# Patient Record
Sex: Male | Born: 1940
Health system: Southern US, Community
[De-identification: ages and names within clinical notes are randomized; demographics above are authoritative.]

## PROBLEM LIST (undated history)

## (undated) DIAGNOSIS — E119 Type 2 diabetes mellitus without complications: Secondary | ICD-10-CM

## (undated) DIAGNOSIS — K222 Esophageal obstruction: Secondary | ICD-10-CM

## (undated) DIAGNOSIS — K449 Diaphragmatic hernia without obstruction or gangrene: Secondary | ICD-10-CM

## (undated) DIAGNOSIS — E785 Hyperlipidemia, unspecified: Secondary | ICD-10-CM

## (undated) DIAGNOSIS — I10 Essential (primary) hypertension: Secondary | ICD-10-CM

## (undated) DIAGNOSIS — R079 Chest pain, unspecified: Secondary | ICD-10-CM

## (undated) DIAGNOSIS — I2510 Atherosclerotic heart disease of native coronary artery without angina pectoris: Secondary | ICD-10-CM

## (undated) DIAGNOSIS — K2990 Gastroduodenitis, unspecified, without bleeding: Secondary | ICD-10-CM

## (undated) DIAGNOSIS — Z720 Tobacco use: Secondary | ICD-10-CM

## (undated) HISTORY — PX: COLONOSCOPY: SHX174

## (undated) HISTORY — PX: SHOULDER SURGERY: SHX246

## (undated) HISTORY — DX: Essential (primary) hypertension: I10

## (undated) HISTORY — PX: CHOLECYSTECTOMY: SHX55

## (undated) HISTORY — PX: JOINT REPLACEMENT: SHX530

## (undated) HISTORY — DX: Hyperlipidemia, unspecified: E78.5

## (undated) HISTORY — PX: INGUINAL HERNIA REPAIR: SUR1180

## (undated) HISTORY — DX: Type 2 diabetes mellitus without complications: E11.9

## (undated) HISTORY — PX: KNEE SURGERY: SHX244

## (undated) HISTORY — PX: CARDIAC CATHETERIZATION: SHX172

## (undated) HISTORY — DX: Atherosclerotic heart disease of native coronary artery without angina pectoris: I25.10

---

## 2002-05-12 ENCOUNTER — Encounter: Payer: Self-pay | Admitting: Orthopedic Surgery

## 2002-05-12 ENCOUNTER — Encounter: Admission: RE | Admit: 2002-05-12 | Discharge: 2002-05-12 | Payer: Self-pay | Admitting: Orthopedic Surgery

## 2007-02-10 ENCOUNTER — Ambulatory Visit: Payer: Self-pay | Admitting: Cardiology

## 2008-02-08 ENCOUNTER — Ambulatory Visit: Payer: Self-pay | Admitting: Cardiology

## 2011-06-28 ENCOUNTER — Other Ambulatory Visit: Payer: Self-pay | Admitting: Internal Medicine

## 2011-06-28 DIAGNOSIS — M549 Dorsalgia, unspecified: Secondary | ICD-10-CM

## 2011-07-09 ENCOUNTER — Other Ambulatory Visit: Payer: Self-pay

## 2011-07-09 ENCOUNTER — Inpatient Hospital Stay
Admission: RE | Admit: 2011-07-09 | Discharge: 2011-07-09 | Payer: Self-pay | Source: Ambulatory Visit | Attending: Internal Medicine | Admitting: Internal Medicine

## 2011-07-09 DIAGNOSIS — M5126 Other intervertebral disc displacement, lumbar region: Secondary | ICD-10-CM

## 2011-11-21 DIAGNOSIS — R072 Precordial pain: Secondary | ICD-10-CM

## 2012-03-11 ENCOUNTER — Encounter: Payer: Self-pay | Admitting: Cardiology

## 2012-03-16 ENCOUNTER — Ambulatory Visit (INDEPENDENT_AMBULATORY_CARE_PROVIDER_SITE_OTHER): Payer: PRIVATE HEALTH INSURANCE | Admitting: Cardiology

## 2012-03-16 ENCOUNTER — Encounter: Payer: Self-pay | Admitting: *Deleted

## 2012-03-16 ENCOUNTER — Encounter: Payer: Self-pay | Admitting: Cardiology

## 2012-03-16 VITALS — BP 142/88 | HR 63 | Ht 67.0 in | Wt 178.8 lb

## 2012-03-16 DIAGNOSIS — R079 Chest pain, unspecified: Secondary | ICD-10-CM | POA: Insufficient documentation

## 2012-03-16 DIAGNOSIS — E785 Hyperlipidemia, unspecified: Secondary | ICD-10-CM | POA: Insufficient documentation

## 2012-03-16 DIAGNOSIS — I10 Essential (primary) hypertension: Secondary | ICD-10-CM | POA: Insufficient documentation

## 2012-03-16 NOTE — Assessment & Plan Note (Signed)
I will defer to [facility], MD with a goal LDL less than 100 and HDL greater than 40.

## 2012-03-16 NOTE — Assessment & Plan Note (Signed)
I have reviewed the previous reported a stress test. After careful questioning I think the pretest probability of obstructive coronary disease is still low. However, he could not be excluded with stress perfusion imaging. I don't think catheterization is indicated but rather would have him have a dobutamine echocardiogram given his multiple risk factors. If this is normal no further testing would be indicated and he will be referred back to [facility], MD for further workup and consideration of possibly a GI etiology.

## 2012-03-16 NOTE — Patient Instructions (Addendum)
Your physician recommends that you schedule a follow-up appointment in: as needed.  Your physician recommends that you continue on your current medications as directed. Please refer to the Current Medication list given to you today.   Your physician has requested that you have a dobutamine myoview. For furth information please visit [email or web address] Please follow instruction sheet, as given.

## 2012-03-16 NOTE — Progress Notes (Signed)
 HPI The patient presents for evaluation of chest discomfort. He has no past cardiac history. He has a history of previous stress testing at the [facility] some years ago for evaluation of chest pain. He thinks this was normal. He says that for several months he's been getting chest discomfort at night. He points to his left axilla and radiating around to his back. His hands will start to tingle. He doesn't describe substernal discomfort, jaw or arm discomfort. He cannot bring this on with activities. He does some heavy activity such as hand cranking the landing gear on his truck. He does not describe associated symptoms such as nausea vomiting or diaphoresis. He doesn't have any palpitations, presyncope or syncope. He denies any acute shortness of breath, PND or orthopnea. He does have to sit up at times because his hands are tingling in the middle of the night. It'll go away after several minutes. He did have a stress perfusion study in April that was somewhat inconclusive with inferior and mid anterior defect consistent with attenuation. The EF was 57%. Obstructive coronary disease could not be definitively excluded.  Not on File  Current Outpatient Prescriptions  Medication Sig Dispense Refill  . calcium carbonate (TUMS - DOSED IN MG ELEMENTAL CALCIUM) 500 MG chewable tablet Chew 1 tablet by mouth as needed.       . enalapril (VASOTEC) 10 MG tablet Take 10 mg by mouth daily.      . meclizine (ANTIVERT) 25 MG tablet Take 25 mg by mouth 3 (three) times daily as needed.      . metFORMIN (GLUCOPHAGE) 850 MG tablet Take 850 mg by mouth daily.      . omeprazole (PRILOSEC) 20 MG capsule Take 20 mg by mouth as needed.       . simvastatin (ZOCOR) 40 MG tablet Take 40 mg by mouth every evening.        Past Medical History  Diagnosis Date  . Diabetes mellitus type II   . Essential hypertension   . Dyslipidemia     Past Surgical History  Procedure Date  . Inguinal hernia repair    bilateral  . Shoulder surgery     right  . Knee surgery     left arthroscopy  . Cholecystectomy     ROS:  As stated in the HPI and negative for all other systems.  PHYSICAL EXAM BP 142/88  Pulse 63  Ht 5' 7" (1.702 m)  Wt 178 lb 12.8 oz (81.103 kg)  BMI 28.00 kg/m2  SpO2 97% GENERAL:  Well appearing, he looks much younger than his stated age  HEENT:  Pupils equal round and reactive, fundi not visualized, oral mucosa unremarkable, dentures and poor dentition  NECK:  No jugular venous distention, waveform within normal limits, carotid upstroke brisk and symmetric, no bruits, no thyromegaly LYMPHATICS:  No cervical, inguinal adenopathy LUNGS:  Clear to auscultation bilaterally BACK:  No CVA tenderness CHEST:  Unremarkable HEART:  PMI not displaced or sustained,S1 and S2 within normal limits, no S3, no S4, no clicks, no rubs, no murmurs ABD:  Flat, positive bowel sounds normal in frequency in pitch, no bruits, no rebound, no guarding, no midline pulsatile mass, no hepatomegaly, no splenomegaly EXT:  2 plus pulses throughout, no edema, no cyanosis no clubbing SKIN:  No rashes no nodules NEURO:  Cranial nerves II through XII grossly intact, motor grossly intact throughout PSYCH:  Cognitively intact, oriented to person place and time   EKG:  Sinus   rhythm, rate 57, premature atrial contractions, axis within normal limits, intervals within normal limits, no acute ST-T wave changes.  ASSESSMENT AND PLAN  

## 2012-03-16 NOTE — Assessment & Plan Note (Signed)
His blood pressure is mildly elevated year. He will continue the meds as listed. He should keep a blood pressure diary. The goal will be 130/85 or lower given his diabetes.

## 2012-03-17 ENCOUNTER — Telehealth: Payer: Self-pay

## 2012-03-17 ENCOUNTER — Other Ambulatory Visit: Payer: Self-pay | Admitting: Cardiology

## 2012-03-17 DIAGNOSIS — R079 Chest pain, unspecified: Secondary | ICD-10-CM

## 2012-03-17 NOTE — Telephone Encounter (Signed)
No precert required 

## 2012-03-17 NOTE — Telephone Encounter (Signed)
Echocardiogram Dobutamine stress  Scheduled for 7-15 mmh Checking percert

## 2012-03-22 DIAGNOSIS — R079 Chest pain, unspecified: Secondary | ICD-10-CM

## 2012-03-23 ENCOUNTER — Encounter (HOSPITAL_COMMUNITY)
Admission: AD | Disposition: A | Discharge status: Home / Self Care | Payer: Self-pay | Source: Other Acute Inpatient Hospital | Attending: Cardiovascular Disease

## 2012-03-23 ENCOUNTER — Encounter (HOSPITAL_COMMUNITY): Payer: Self-pay | Admitting: *Deleted

## 2012-03-23 ENCOUNTER — Observation Stay (HOSPITAL_COMMUNITY)
Admission: AD | Admit: 2012-03-23 | Discharge: 2012-03-25 | DRG: 313 | Disposition: A | Discharge status: Home / Self Care | Payer: Medicare Other | Source: Other Acute Inpatient Hospital | Attending: Cardiovascular Disease | Admitting: Cardiovascular Disease

## 2012-03-23 ENCOUNTER — Other Ambulatory Visit: Payer: Self-pay | Admitting: Physician Assistant

## 2012-03-23 ENCOUNTER — Other Ambulatory Visit: Payer: Self-pay

## 2012-03-23 DIAGNOSIS — E785 Hyperlipidemia, unspecified: Secondary | ICD-10-CM | POA: Insufficient documentation

## 2012-03-23 DIAGNOSIS — I2510 Atherosclerotic heart disease of native coronary artery without angina pectoris: Secondary | ICD-10-CM

## 2012-03-23 DIAGNOSIS — Q391 Atresia of esophagus with tracheo-esophageal fistula: Secondary | ICD-10-CM | POA: Insufficient documentation

## 2012-03-23 DIAGNOSIS — F17200 Nicotine dependence, unspecified, uncomplicated: Secondary | ICD-10-CM | POA: Insufficient documentation

## 2012-03-23 DIAGNOSIS — R079 Chest pain, unspecified: Secondary | ICD-10-CM

## 2012-03-23 DIAGNOSIS — Q393 Congenital stenosis and stricture of esophagus: Secondary | ICD-10-CM | POA: Insufficient documentation

## 2012-03-23 DIAGNOSIS — R0789 Other chest pain: Principal | ICD-10-CM | POA: Insufficient documentation

## 2012-03-23 DIAGNOSIS — K449 Diaphragmatic hernia without obstruction or gangrene: Secondary | ICD-10-CM | POA: Insufficient documentation

## 2012-03-23 DIAGNOSIS — I200 Unstable angina: Secondary | ICD-10-CM

## 2012-03-23 DIAGNOSIS — E119 Type 2 diabetes mellitus without complications: Secondary | ICD-10-CM | POA: Insufficient documentation

## 2012-03-23 DIAGNOSIS — K2990 Gastroduodenitis, unspecified, without bleeding: Secondary | ICD-10-CM

## 2012-03-23 DIAGNOSIS — K2940 Chronic atrophic gastritis without bleeding: Secondary | ICD-10-CM | POA: Insufficient documentation

## 2012-03-23 DIAGNOSIS — K222 Esophageal obstruction: Secondary | ICD-10-CM

## 2012-03-23 DIAGNOSIS — K2980 Duodenitis without bleeding: Secondary | ICD-10-CM | POA: Insufficient documentation

## 2012-03-23 DIAGNOSIS — I10 Essential (primary) hypertension: Secondary | ICD-10-CM | POA: Insufficient documentation

## 2012-03-23 HISTORY — DX: Gastroduodenitis, unspecified, without bleeding: K29.90

## 2012-03-23 HISTORY — DX: Esophageal obstruction: K22.2

## 2012-03-23 HISTORY — DX: Diaphragmatic hernia without obstruction or gangrene: K44.9

## 2012-03-23 HISTORY — DX: Chest pain, unspecified: R07.9

## 2012-03-23 HISTORY — PX: LEFT HEART CATHETERIZATION WITH CORONARY ANGIOGRAM: SHX5451

## 2012-03-23 HISTORY — DX: Tobacco use: Z72.0

## 2012-03-23 LAB — GLUCOSE, CAPILLARY: Glucose-Capillary: 98 mg/dL (ref 70–99)

## 2012-03-23 LAB — LIPASE, BLOOD: Lipase: 17 U/L (ref 11–59)

## 2012-03-23 LAB — HEPATIC FUNCTION PANEL
Albumin: 3.5 g/dL (ref 3.5–5.2)
Indirect Bilirubin: 0.3 mg/dL (ref 0.3–0.9)
Total Bilirubin: 0.4 mg/dL (ref 0.3–1.2)
Total Protein: 6.5 g/dL (ref 6.0–8.3)

## 2012-03-23 SURGERY — LEFT HEART CATHETERIZATION WITH CORONARY ANGIOGRAM
Anesthesia: LOCAL

## 2012-03-23 MED ORDER — DIAZEPAM 2 MG PO TABS: 2.0000 mg | ORAL_TABLET | Freq: Two times a day (BID) | ORAL | Status: DC | PRN

## 2012-03-23 MED ORDER — PANTOPRAZOLE SODIUM 40 MG PO TBEC
40.0000 mg | DELAYED_RELEASE_TABLET | Freq: Every day | ORAL | Status: DC
  Administered 2012-03-23: 20:00:00 40 mg via ORAL
  Filled 2012-03-23: qty 1

## 2012-03-23 MED ORDER — ONDANSETRON HCL 4 MG/2ML IJ SOLN: 4.0000 mg | Freq: Four times a day (QID) | INTRAMUSCULAR | Status: DC | PRN

## 2012-03-23 MED ORDER — ASPIRIN EC 81 MG PO TBEC
81.0000 mg | DELAYED_RELEASE_TABLET | Freq: Every day | ORAL | Status: DC
  Administered 2012-03-24: 81 mg via ORAL
  Filled 2012-03-23 (×2): qty 1

## 2012-03-23 MED ORDER — ACETAMINOPHEN 325 MG PO TABS: 650.0000 mg | ORAL_TABLET | ORAL | Status: DC | PRN

## 2012-03-23 MED ORDER — ASPIRIN 81 MG PO CHEW: 324.0000 mg | CHEWABLE_TABLET | ORAL | Status: DC

## 2012-03-23 MED ORDER — MIDAZOLAM HCL 2 MG/2ML IJ SOLN
INTRAMUSCULAR | Status: AC
  Filled 2012-03-23: qty 2

## 2012-03-23 MED ORDER — SIMVASTATIN 40 MG PO TABS: 40.0000 mg | ORAL_TABLET | Freq: Every day | ORAL | Status: DC

## 2012-03-23 MED ORDER — SODIUM CHLORIDE 0.9 % IJ SOLN
3.0000 mL | Freq: Two times a day (BID) | INTRAMUSCULAR | Status: DC
  Administered 2012-03-24 – 2012-03-25 (×3): 3 mL via INTRAVENOUS

## 2012-03-23 MED ORDER — NITROGLYCERIN 0.2 MG/ML ON CALL CATH LAB
INTRAVENOUS | Status: AC
  Filled 2012-03-23: qty 1

## 2012-03-23 MED ORDER — PANTOPRAZOLE SODIUM 40 MG PO TBEC: 40.0000 mg | DELAYED_RELEASE_TABLET | Freq: Every day | ORAL | Status: DC

## 2012-03-23 MED ORDER — OXYCODONE-ACETAMINOPHEN 5-325 MG PO TABS: 1.0000 | ORAL_TABLET | ORAL | Status: DC | PRN

## 2012-03-23 MED ORDER — NITROGLYCERIN 0.4 MG SL SUBL: 0.4000 mg | SUBLINGUAL_TABLET | SUBLINGUAL | Status: DC | PRN

## 2012-03-23 MED ORDER — SODIUM CHLORIDE 0.9 % IV SOLN: 250.0000 mL | INTRAVENOUS | Status: DC

## 2012-03-23 MED ORDER — FENTANYL CITRATE 0.05 MG/ML IJ SOLN
INTRAMUSCULAR | Status: AC
  Filled 2012-03-23: qty 2

## 2012-03-23 MED ORDER — ENALAPRIL MALEATE 10 MG PO TABS: 10.0000 mg | ORAL_TABLET | Freq: Every day | ORAL | Status: DC

## 2012-03-23 MED ORDER — ENALAPRIL MALEATE 10 MG PO TABS
10.0000 mg | ORAL_TABLET | Freq: Every day | ORAL | Status: DC
  Administered 2012-03-23 – 2012-03-25 (×3): 10 mg via ORAL
  Filled 2012-03-23 (×3): qty 1

## 2012-03-23 MED ORDER — ASPIRIN 300 MG RE SUPP
300.0000 mg | RECTAL | Status: DC
  Filled 2012-03-23: qty 1

## 2012-03-23 MED ORDER — SODIUM CHLORIDE 0.45 % IV SOLN
INTRAVENOUS | Status: AC
  Administered 2012-03-23: 17:00:00 via INTRAVENOUS

## 2012-03-23 MED ORDER — SIMVASTATIN 40 MG PO TABS
40.0000 mg | ORAL_TABLET | Freq: Every evening | ORAL | Status: DC
  Administered 2012-03-23 – 2012-03-24 (×2): 40 mg via ORAL
  Filled 2012-03-23 (×3): qty 1

## 2012-03-23 MED ORDER — SODIUM CHLORIDE 0.9 % IJ SOLN: 3.0000 mL | INTRAMUSCULAR | Status: DC | PRN

## 2012-03-23 MED ORDER — HEPARIN (PORCINE) IN NACL 2-0.9 UNIT/ML-% IJ SOLN
INTRAMUSCULAR | Status: AC
  Filled 2012-03-23: qty 2000

## 2012-03-23 MED ORDER — LIDOCAINE HCL (PF) 1 % IJ SOLN
INTRAMUSCULAR | Status: AC
  Filled 2012-03-23: qty 30

## 2012-03-23 NOTE — H&P (View-Only) (Signed)
HPI The patient presents for evaluation of chest discomfort. He has no past cardiac history. He has a history of previous stress testing at the [facility] some years ago for evaluation of chest pain. He thinks this was normal. He says that for several months he's been getting chest discomfort at night. He points to his left axilla and radiating around to his back. His hands will start to tingle. He doesn't describe substernal discomfort, jaw or arm discomfort. He cannot bring this on with activities. He does some heavy activity such as hand cranking the landing gear on his truck. He does not describe associated symptoms such as nausea vomiting or diaphoresis. He doesn't have any palpitations, presyncope or syncope. He denies any acute shortness of breath, PND or orthopnea. He does have to sit up at times because his hands are tingling in the middle of the night. It'll go away after several minutes. He did have a stress perfusion study in April that was somewhat inconclusive with inferior and mid anterior defect consistent with attenuation. The EF was 57%. Obstructive coronary disease could not be definitively excluded.  Not on File  Current Outpatient Prescriptions  Medication Sig Dispense Refill  . calcium carbonate (TUMS - DOSED IN MG ELEMENTAL CALCIUM) 500 MG chewable tablet Chew 1 tablet by mouth as needed.       . enalapril (VASOTEC) 10 MG tablet Take 10 mg by mouth daily.      . meclizine (ANTIVERT) 25 MG tablet Take 25 mg by mouth 3 (three) times daily as needed.      . metFORMIN (GLUCOPHAGE) 850 MG tablet Take 850 mg by mouth daily.      Marland Kitchen omeprazole (PRILOSEC) 20 MG capsule Take 20 mg by mouth as needed.       . simvastatin (ZOCOR) 40 MG tablet Take 40 mg by mouth every evening.        Past Medical History  Diagnosis Date  . Diabetes mellitus type II   . Essential hypertension   . Dyslipidemia     Past Surgical History  Procedure Date  . Inguinal hernia repair    bilateral  . Shoulder surgery     right  . Knee surgery     left arthroscopy  . Cholecystectomy     ROS:  As stated in the HPI and negative for all other systems.  PHYSICAL EXAM BP 142/88  Pulse 63  Ht 5\' 7"  (1.702 m)  Wt 178 lb 12.8 oz (81.103 kg)  BMI 28.00 kg/m2  SpO2 97% GENERAL:  Well appearing, he looks much younger than his stated age  HEENT:  Pupils equal round and reactive, fundi not visualized, oral mucosa unremarkable, dentures and poor dentition  NECK:  No jugular venous distention, waveform within normal limits, carotid upstroke brisk and symmetric, no bruits, no thyromegaly LYMPHATICS:  No cervical, inguinal adenopathy LUNGS:  Clear to auscultation bilaterally BACK:  No CVA tenderness CHEST:  Unremarkable HEART:  PMI not displaced or sustained,S1 and S2 within normal limits, no S3, no S4, no clicks, no rubs, no murmurs ABD:  Flat, positive bowel sounds normal in frequency in pitch, no bruits, no rebound, no guarding, no midline pulsatile mass, no hepatomegaly, no splenomegaly EXT:  2 plus pulses throughout, no edema, no cyanosis no clubbing SKIN:  No rashes no nodules NEURO:  Cranial nerves II through XII grossly intact, motor grossly intact throughout PSYCH:  Cognitively intact, oriented to person place and time   EKG:  Sinus  rhythm, rate 57, premature atrial contractions, axis within normal limits, intervals within normal limits, no acute ST-T wave changes.  ASSESSMENT AND PLAN

## 2012-03-23 NOTE — Interval H&P Note (Signed)
History and Physical Interval Note:  03/23/2012 4:03 PM  Richard Rocha  has presented today for surgery, with the diagnosis of Chest pain  The various methods of treatment have been discussed with the patient and family. After consideration of risks, benefits and other options for treatment, the patient has consented to  Procedure(s) (LRB): LEFT HEART CATHETERIZATION WITH CORONARY ANGIOGRAM (N/A) as a surgical intervention .  The patient's history has been reviewed, patient examined, no change in status, stable for surgery.  I have reviewed the patients' chart and labs.  Questions were answered to the patient's satisfaction.     Charlton Haws

## 2012-03-23 NOTE — CV Procedure (Signed)
      Catheterization   Indication: Recurrent chest pain  Procedure: After informed consent and clinical "time out" the left groin was prepped and draped in a sterile fashion.  The patient had recent rigth hernia surgery and was still a bit tender  A 5Fr sheath was placed in the right femoral artery using seldinger technique and local lidocaine.  Standard JL4, JR4 and angled pigtail catheters were used to engage the coronary arteries.  Coronary arteries were visualized in orthogonal views using caudal and cranial angulation.  RAO ventriculography was done using 24* cc of contrast.    Medications:   Versed: 2 mg's  Fentanyl: 25 ug's  Coronary Arteries: Right dominant with no anomalies  LM: Normal  LAD: 30% ostial stenosis smooth. Normal mid and distal vessel    D1: normal and small  D2 normal and large  Circumflex: left dominant normal   OM1: normal  OM2: nomral  RCA: small nondominant and normal   PDA: left sided normal  PLA: left sided and normal  Ventriculography: EF: 65 %,  No RWMA;s  Hemodynamics:  Aortic Pressure: 157 73  mmHg  LV Pressure: 157 7  mmHg  Impression:  No signifcant CAD.  Per Dr Antoine Poche LFTls amylase and lipase drawn in lab and our GI doctors will see in am  Charlton Haws 03/23/2012 4:51 PM

## 2012-03-24 ENCOUNTER — Other Ambulatory Visit: Payer: Self-pay

## 2012-03-24 ENCOUNTER — Inpatient Hospital Stay (HOSPITAL_COMMUNITY): Payer: Medicare Other

## 2012-03-24 DIAGNOSIS — Q391 Atresia of esophagus with tracheo-esophageal fistula: Secondary | ICD-10-CM

## 2012-03-24 DIAGNOSIS — R079 Chest pain, unspecified: Secondary | ICD-10-CM

## 2012-03-24 DIAGNOSIS — Q393 Congenital stenosis and stricture of esophagus: Secondary | ICD-10-CM

## 2012-03-24 DIAGNOSIS — K2990 Gastroduodenitis, unspecified, without bleeding: Secondary | ICD-10-CM

## 2012-03-24 DIAGNOSIS — K2970 Gastritis, unspecified, without bleeding: Secondary | ICD-10-CM

## 2012-03-24 LAB — GLUCOSE, CAPILLARY
Glucose-Capillary: 138 mg/dL — ABNORMAL HIGH (ref 70–99)
Glucose-Capillary: 116 mg/dL — ABNORMAL HIGH (ref 70–99)
Glucose-Capillary: 160 mg/dL — ABNORMAL HIGH (ref 70–99)
Glucose-Capillary: 178 mg/dL — ABNORMAL HIGH (ref 70–99)

## 2012-03-24 LAB — BASIC METABOLIC PANEL
CO2: 24 mEq/L (ref 19–32)
Calcium: 9 mg/dL (ref 8.4–10.5)
Creatinine, Ser: 0.83 mg/dL (ref 0.50–1.35)
GFR calc Af Amer: >90 mL/min (ref >90)
GFR calc non Af Amer: 86 mL/min — ABNORMAL LOW (ref >90)
Sodium: 139 mEq/L (ref 135–145)

## 2012-03-24 LAB — LIPID PANEL
Cholesterol: 148 mg/dL (ref 0–200)
Triglycerides: 237 mg/dL — ABNORMAL HIGH (ref <150)

## 2012-03-24 LAB — CBC
MCH: 30.1 pg (ref 26.0–34.0)
Platelets: 167 10*3/uL (ref 150–400)
RBC: 4.82 MIL/uL (ref 4.22–5.81)
RDW: 13.7 % (ref 11.5–15.5)

## 2012-03-24 MED ORDER — PANTOPRAZOLE SODIUM 40 MG PO TBEC
40.0000 mg | DELAYED_RELEASE_TABLET | Freq: Two times a day (BID) | ORAL | Status: DC
  Administered 2012-03-24 – 2012-03-25 (×2): 40 mg via ORAL
  Filled 2012-03-24 (×2): qty 1

## 2012-03-24 NOTE — Consult Note (Signed)
Referring Provider: No ref. provider found Primary Care Physician:  Kirstie Peri, MD Primary Gastroenterologist:  none  Reason for Consultation: epigastric and chest pain  HPI: Richard Rocha is a 71 y.o. male with history of adult onset diabetes mellitus, hypertension and hyperlipidemia. He is status post cholecystectomy done several years ago and has had several orthopedic surgeries. He was admitted yesterday for further evaluation of subxiphoid and chest pain. Patient had presented to [facility] early on Sunday morning after being awakened with pain. He underwent catheter yesterday which did not show any significant coronary artery disease.  Patient says he has been having similar episodes of pain intermittently over the past several months, these episodes have become more frequent and more intense when they do occur. He says there is no pattern is worse he can tell, however he seems to be having frequent symptoms in the middle of the night. He describes the pain as being located in the epigastrium very sharp radiating straight through into his back which she describes as knifelike and then up into his mid chest. He says these usually only last for about 10 minutes and then resolve. The episode he had on Sunday did not resolve quickly and he came to the ER. These episodes are not associated with any nausea vomiting diaphoresis or shortness of breath. He says his appetite has been fine his weight has been stable he denies any dysphagia or odynophagia. He does have intermittent problems with heartburn and says that he has Prilosec which he does not take regularly and also uses TUMS as needed. He denies any regular EtOH, he had been taking one baby aspirin daily denies any regular NSAIDs He has not had any prior GI evaluation. He feels fine today and says he has not had any pain since admission to the hospital. Review of his labs show a normal CBC, normal hepatic panel, amylase, and lipase on  admission.   Past Medical History  Diagnosis Date  . Diabetes mellitus type II   . Essential hypertension   . Dyslipidemia   . Shortness of breath   . Arthritis     Past Surgical History  Procedure Date  . Inguinal hernia repair     bilateral  . Shoulder surgery     right  . Knee surgery     left arthroscopy  . Cholecystectomy   . Joint replacement     ltknee arthroscopic,rt shoulder arthroscopic    Prior to Admission medications   Medication Sig Start Date End Date Taking? Authorizing Provider  calcium carbonate (TUMS - DOSED IN MG ELEMENTAL CALCIUM) 500 MG chewable tablet Chew 1 tablet by mouth as needed. For indigestion   Yes Historical Provider, MD  enalapril (VASOTEC) 10 MG tablet Take 10 mg by mouth daily.   Yes Historical Provider, MD  metFORMIN (GLUCOPHAGE) 850 MG tablet Take 850 mg by mouth daily.   Yes Historical Provider, MD  Multiple Vitamin (MULTIVITAMIN WITH MINERALS) TABS Take 1 tablet by mouth daily.   Yes Historical Provider, MD  omeprazole (PRILOSEC) 20 MG capsule Take 20 mg by mouth as needed.    Yes Historical Provider, MD  simvastatin (ZOCOR) 40 MG tablet Take 40 mg by mouth every evening.   Yes Historical Provider, MD    Current Facility-Administered Medications  Medication Dose Route Frequency Provider Last Rate Last Dose  . 0.45 % sodium chloride infusion   Intravenous Continuous Wendall Stade, MD 75 mL/hr at 03/23/12 1717    . 0.9 %  sodium chloride infusion  250 mL Intravenous Continuous Wendall Stade, MD      . acetaminophen (TYLENOL) tablet 650 mg  650 mg Oral Q4H PRN Wendall Stade, MD      . aspirin EC tablet 81 mg  81 mg Oral Daily Prescott Parma, PA   81 mg at 03/24/12 0940  . diazepam (VALIUM) tablet 2 mg  2 mg Oral Q12H PRN Wendall Stade, MD      . enalapril (VASOTEC) tablet 10 mg  10 mg Oral Daily Wendall Stade, MD   10 mg at 03/24/12 0940  . fentaNYL (SUBLIMAZE) 0.05 MG/ML injection           . heparin 2-0.9 UNIT/ML-% infusion            . lidocaine (XYLOCAINE) 1 % injection           . midazolam (VERSED) 2 MG/2ML injection           . nitroGLYCERIN (NITROSTAT) SL tablet 0.4 mg  0.4 mg Sublingual Q5 Min x 3 PRN Prescott Parma, PA      . nitroGLYCERIN (NTG ON-CALL) 0.2 mg/mL injection           . ondansetron (ZOFRAN) injection 4 mg  4 mg Intravenous Q6H PRN Wendall Stade, MD      . ondansetron [facility]) injection 4 mg  4 mg Intravenous Q6H PRN Prescott Parma, PA      . oxyCODONE-acetaminophen (PERCOCET) 5-325 MG per tablet 1-2 tablet  1-2 tablet Oral Q4H PRN Wendall Stade, MD      . pantoprazole (PROTONIX) EC tablet 40 mg  40 mg Oral Q1200 Wendall Stade, MD   40 mg at 03/23/12 2005  . simvastatin (ZOCOR) tablet 40 mg  40 mg Oral QPM Wendall Stade, MD   40 mg at 03/23/12 2004  . sodium chloride 0.9 % injection 3 mL  3 mL Intravenous Q12H Wendall Stade, MD   3 mL at 03/24/12 0943  . sodium chloride 0.9 % injection 3 mL  3 mL Intravenous PRN Wendall Stade, MD      . DISCONTD: acetaminophen (TYLENOL) tablet 650 mg  650 mg Oral Q4H PRN Prescott Parma, PA      . DISCONTD: aspirin chewable tablet 324 mg  324 mg Oral NOW Prescott Parma, PA      . DISCONTD: aspirin suppository 300 mg  300 mg Rectal NOW Prescott Parma, PA      . DISCONTD: enalapril (VASOTEC) tablet 10 mg  10 mg Oral Daily Prescott Parma, PA      . DISCONTD: pantoprazole (PROTONIX) EC tablet 40 mg  40 mg Oral Q1200 Prescott Parma, PA      . DISCONTD: simvastatin (ZOCOR) tablet 40 mg  40 mg Oral q1800 Prescott Parma, PA        Allergies as of 03/23/2012  . (Not on File)    Family History  Problem Relation Age of Onset  . Cancer Father     Lung  . Coronary artery disease Brother 87    History   Social History  . Marital Status: Married    Spouse Name: N/A    Number of Children: 3  . Years of Education: N/A   Occupational History  . Truck driving (part time)    Social History Main Topics  . Smoking status: Current Everyday Smoker -- 1.0 packs/day for 60 years     Types: Cigarettes  . Smokeless tobacco: Not  on file   Comment: He has quit many times.  . Alcohol Use: 0.6 oz/week    1 Shots of liquor per week     once in a while  . Drug Use: No  . Sexually Active: Yes    Birth Control/ Protection: None   Other Topics Concern  . Not on file   Social History Narrative   Lives with wife.      Review of Systems: Pertinent positive and negative review of systems were noted in the above HPI section.  All other review of systems was otherwise negative.  Physical Exam: Vital signs in last 24 hours: Temp:  [97.5 F (36.4 C)-98.1 F (36.7 C)] 97.7 F (36.5 C) (07/16 0853) Pulse Rate:  [44-70] 64  (07/16 0853) Resp:  [15-20] 15  (07/16 0853) BP: (118-149)/(52-87) 124/69 mmHg (07/16 0853) SpO2:  [97 %-99 %] 99 % (07/16 0853) Weight:  [192 lb 9.6 oz (87.363 kg)] 192 lb 9.6 oz (87.363 kg) (07/16 0100) Last BM Date: 03/23/12 General:   Alert,  Well-developed, well-nourished, pleasant and cooperative in NAD Head:  Normocephalic and atraumatic. Eyes:  Sclera clear, no icterus.   Conjunctiva pink. Ears:  Normal auditory acuity. Nose:  No deformity, discharge,  or lesions. Mouth:  No deformity or lesions.   Neck:  Supple; no masses or thyromegaly. Lungs:  Clear throughout to auscultation.   No wheezes, crackles, or rhonchi. Heart:  Regular rate and rhythm; no murmurs, clicks, rubs,  or gallops. Abdomen:  Soft,nontender, BS active,nonpalp mass or hsm.   Rectal:  Deferred  Msk:  Symmetrical without gross deformities. . Pulses:  Normal pulses noted. Extremities:  Without clubbing or edema. Neurologic:  Alert and  oriented x4;  grossly normal neurologically. Skin:  Intact without significant lesions or rashes.. Psych:  Alert and cooperative. Normal mood and affect.  Intake/Output from previous day: 07/15 0701 - 07/16 0700 In: 368.8 [P.O.:240; I.V.:128.8] Out: 600 [Urine:600] Intake/Output this shift:    Lab Results:  Basename 03/24/12 0515    WBC 11.0*  HGB 14.5  HCT 43.2  PLT 167   BMET  Basename 03/24/12 0515  NA 139  K 4.1  CL 104  CO2 24  GLUCOSE 133*  BUN 13  CREATININE 0.83  CALCIUM 9.0   LFT  Basename 03/23/12 1600  PROT 6.5  ALBUMIN 3.5  AST 13  ALT 11  ALKPHOS 62  BILITOT 0.4  BILIDIR 0.1  IBILI 0.3   PT/INR No results found for this basename: LABPROT:2,INR:2 in the last 72 hours Hepatitis Panel No results found for this basename: HEPBSAG,HCVAB,HEPAIGM,HEPBIGM in the last 72 hours    Studies/Results: none IMPRESSION:  #58 71 year old male with several month history of intermittent subxiphoid pain radiating into his back and into his chest, presented to the hospital with a more intense prolonged episode. Cardiac catheter done yesterday was negative. Etiology of his pain is not clear. We'll need to rule out peptic ulcer disease, gastritis, erosive esophagitis, or biliary pain. Patient is status post cholecystectomy. #2 hypertension #3 hyperlipidemia #4 adult onset diabetes mellitus #5 status post bilateral  inguinal hernia repairs, and cholecystectomy  PLAN: #1 schedule for upper abdominal ultrasound today #2 schedule for upper endoscopy in a.m. tomorrow with Dr. Rhea Belton #3 twice-daily protonix  Further plans pending results of above. Procedure was discussed in detail with the patient and he is agreeable to proceed   Amy Esterwood  03/24/2012, 10:43 AM

## 2012-03-24 NOTE — Consult Note (Signed)
Patient seen, examined, and I agree with the above documentation, including the assessment and plan. Abdominal ultrasound negative, status post cholecystectomy. We'll proceed with upper endoscopy tomorrow. Continue twice a day PPI for now. Further recs thereafter, likely discharge after procedure

## 2012-03-24 NOTE — Progress Notes (Signed)
SUBJECTIVE:  No chest pain   PHYSICAL EXAM Filed Vitals:   03/23/12 1900 03/23/12 1954 03/23/12 2000 03/24/12 0100  BP: 138/68 147/62 149/68 118/52  Pulse: 61 64 61   Temp:  97.5 F (36.4 C)  97.7 F (36.5 C)  TempSrc:  Oral  Oral  Resp: 20 20 17 15   Weight:    192 lb 9.6 oz (87.363 kg)  SpO2:  98%  99%   General:  No SOB Lungs:  Clear Heart:  RRR Abdomen:  Positive bowel sounds, no rebound no guarding Extremities:  Right wrist with slight bruising.  Left groin without bruising  LABS:  Results for orders placed during the hospital encounter of 03/23/12 (from the past 24 hour(s))  AMYLASE     Status: Normal   Collection Time   03/23/12  4:00 PM      Component Value Range   Amylase 35  0 - 105 U/L  HEPATIC FUNCTION PANEL     Status: Normal   Collection Time   03/23/12  4:00 PM      Component Value Range   Total Protein 6.5  6.0 - 8.3 g/dL   Albumin 3.5  3.5 - 5.2 g/dL   AST 13  0 - 37 U/L   ALT 11  0 - 53 U/L   Alkaline Phosphatase 62  39 - 117 U/L   Total Bilirubin 0.4  0.3 - 1.2 mg/dL   Bilirubin, Direct 0.1  0.0 - 0.3 mg/dL   Indirect Bilirubin 0.3  0.3 - 0.9 mg/dL  LIPASE, BLOOD     Status: Normal   Collection Time   03/23/12  4:00 PM      Component Value Range   Lipase 17  11 - 59 U/L  POCT ACTIVATED CLOTTING TIME     Status: Normal   Collection Time   03/23/12  4:50 PM      Component Value Range   Activated Clotting Time 104    GLUCOSE, CAPILLARY     Status: Normal   Collection Time   03/23/12  5:09 PM      Component Value Range   Glucose-Capillary 85  70 - 99 mg/dL  GLUCOSE, CAPILLARY     Status: Normal   Collection Time   03/23/12  5:59 PM      Component Value Range   Glucose-Capillary 98  70 - 99 mg/dL  GLUCOSE, CAPILLARY     Status: Abnormal   Collection Time   03/23/12  9:37 PM      Component Value Range   Glucose-Capillary 148 (*) 70 - 99 mg/dL   Comment 1 Documented in Chart     Comment 2 Notify RN    CBC     Status: Abnormal   Collection  Time   03/24/12  5:15 AM      Component Value Range   WBC 11.0 (*) 4.0 - 10.5 K/uL   RBC 4.82  4.22 - 5.81 MIL/uL   Hemoglobin 14.5  13.0 - 17.0 g/dL   HCT 16.1  09.6 - 04.5 %   MCV 89.6  78.0 - 100.0 fL   MCH 30.1  26.0 - 34.0 pg   MCHC 33.6  30.0 - 36.0 g/dL   RDW 40.9  81.1 - 91.4 %   Platelets 167  150 - 400 K/uL  BASIC METABOLIC PANEL     Status: Abnormal   Collection Time   03/24/12  5:15 AM      Component Value Range  Sodium 139  135 - 145 mEq/L   Potassium 4.1  3.5 - 5.1 mEq/L   Chloride 104  96 - 112 mEq/L   CO2 24  19 - 32 mEq/L   Glucose, Bld 133 (*) 70 - 99 mg/dL   BUN 13  6 - 23 mg/dL   Creatinine, Ser 1.47  0.50 - 1.35 mg/dL   Calcium 9.0  8.4 - 82.9 mg/dL   GFR calc non Af Amer 86 (*) >90 mL/min   GFR calc Af Amer >90  >90 mL/min  LIPID PANEL     Status: Abnormal   Collection Time   03/24/12  5:15 AM      Component Value Range   Cholesterol 148  0 - 200 mg/dL   Triglycerides 562 (*) <150 mg/dL   HDL 31 (*) >13 mg/dL   Total CHOL/HDL Ratio 4.8     VLDL 47 (*) 0 - 40 mg/dL   LDL Cholesterol 70  0 - 99 mg/dL  GLUCOSE, CAPILLARY     Status: Abnormal   Collection Time   03/24/12  8:03 AM      Component Value Range   Glucose-Capillary 138 (*) 70 - 99 mg/dL    Intake/Output Summary (Last 24 hours) at 03/24/12 0825 Last data filed at 03/24/12 0400  Gross per 24 hour  Intake 368.75 ml  Output    600 ml  Net -231.25 ml     ASSESSMENT AND PLAN:  Chest pain:  No evidence of a cardiac etiology to his pain.  I have asked for a GI consult. Lipase and amylase WNL.  If there are no plans for in house procedure he can be discharged today.  Ratchford Raef Sprigg 03/24/2012 8:25 AM

## 2012-03-24 NOTE — Progress Notes (Signed)
Utilization review completed.  

## 2012-03-25 ENCOUNTER — Encounter (HOSPITAL_COMMUNITY)
Admission: AD | Disposition: A | Discharge status: Home / Self Care | Payer: Self-pay | Source: Other Acute Inpatient Hospital | Attending: Cardiovascular Disease

## 2012-03-25 ENCOUNTER — Encounter (HOSPITAL_COMMUNITY): Payer: Self-pay

## 2012-03-25 DIAGNOSIS — K222 Esophageal obstruction: Secondary | ICD-10-CM

## 2012-03-25 DIAGNOSIS — K2990 Gastroduodenitis, unspecified, without bleeding: Secondary | ICD-10-CM

## 2012-03-25 HISTORY — PX: ESOPHAGOGASTRODUODENOSCOPY: SHX5428

## 2012-03-25 SURGERY — EGD (ESOPHAGOGASTRODUODENOSCOPY)
Anesthesia: Moderate Sedation

## 2012-03-25 MED ORDER — SODIUM CHLORIDE 0.9 % IV SOLN
INTRAVENOUS | Status: DC
  Administered 2012-03-25: 500 mL via INTRAVENOUS

## 2012-03-25 MED ORDER — BUTAMBEN-TETRACAINE-BENZOCAINE 2-2-14 % EX AERO
INHALATION_SPRAY | CUTANEOUS | Status: DC | PRN
  Administered 2012-03-25: 2 via TOPICAL

## 2012-03-25 MED ORDER — FENTANYL CITRATE 0.05 MG/ML IJ SOLN
INTRAMUSCULAR | Status: AC
  Filled 2012-03-25: qty 4

## 2012-03-25 MED ORDER — METFORMIN HCL 850 MG PO TABS: 850.0000 mg | ORAL_TABLET | Freq: Every day | ORAL | Status: DC

## 2012-03-25 MED ORDER — MIDAZOLAM HCL 10 MG/2ML IJ SOLN
INTRAMUSCULAR | Status: AC
  Filled 2012-03-25: qty 2

## 2012-03-25 MED ORDER — PANTOPRAZOLE SODIUM 40 MG PO TBEC
40.0000 mg | DELAYED_RELEASE_TABLET | Freq: Every day | ORAL | Status: DC
  Filled 2012-03-25: qty 1

## 2012-03-25 MED ORDER — MIDAZOLAM HCL 10 MG/2ML IJ SOLN
INTRAMUSCULAR | Status: DC | PRN
  Administered 2012-03-25: 1 mg via INTRAVENOUS
  Administered 2012-03-25 (×2): 2 mg via INTRAVENOUS
  Administered 2012-03-25: 1 mg via INTRAVENOUS

## 2012-03-25 MED ORDER — PANTOPRAZOLE SODIUM 40 MG PO TBEC: 40.0000 mg | DELAYED_RELEASE_TABLET | Freq: Every day | ORAL | Status: DC

## 2012-03-25 MED ORDER — FENTANYL CITRATE 0.05 MG/ML IJ SOLN
INTRAMUSCULAR | Status: DC | PRN
  Administered 2012-03-25 (×2): 25 ug via INTRAVENOUS

## 2012-03-25 NOTE — Progress Notes (Signed)
    SUBJECTIVE:  No chest pain.  Mild back pain.     PHYSICAL EXAM Filed Vitals:   03/24/12 1951 03/24/12 2331 03/25/12 0415 03/25/12 0828  BP: 118/76 105/56 109/74 93/64  Pulse:      Temp: 98.2 F (36.8 C) 97.9 F (36.6 C) 98.3 F (36.8 C) 98 F (36.7 C)  TempSrc: Oral Oral Oral Oral  Resp: 12  16 16   Height:      Weight:      SpO2:  95% 96% 96%   General:  No SOB Lungs:  Clear Heart:  RRR Abdomen:  Positive bowel sounds, no rebound no guarding Extremities:  Right wrist with slight bruising improved  LABS:  Results for orders placed during the hospital encounter of 03/23/12 (from the past 24 hour(s))  GLUCOSE, CAPILLARY     Status: Abnormal   Collection Time   03/24/12 12:18 PM      Component Value Range   Glucose-Capillary 116 (*) 70 - 99 mg/dL  GLUCOSE, CAPILLARY     Status: Abnormal   Collection Time   03/24/12  5:06 PM      Component Value Range   Glucose-Capillary 160 (*) 70 - 99 mg/dL  GLUCOSE, CAPILLARY     Status: Abnormal   Collection Time   03/24/12  9:25 PM      Component Value Range   Glucose-Capillary 178 (*) 70 - 99 mg/dL   Comment 1 Documented in Chart     Comment 2 Notify RN    GLUCOSE, CAPILLARY     Status: Abnormal   Collection Time   03/25/12  8:18 AM      Component Value Range   Glucose-Capillary 140 (*) 70 - 99 mg/dL    Intake/Output Summary (Last 24 hours) at 03/25/12 0847 Last data filed at 03/25/12 0600  Gross per 24 hour  Intake      0 ml  Output    150 ml  Net   -150 ml     ASSESSMENT AND PLAN:  Chest pain:  No evidence of a cardiac etiology to his pain.  EGD today. Home if no further GI work up planned.  Follow with primary MD.     Rollene Rotunda 03/25/2012 8:47 AM

## 2012-03-25 NOTE — Plan of Care (Signed)
Problem: Phase III Progression Outcomes Goal: Discharge plan remains appropriate-arrangements made Outcome: Progressing Possible discharge today post EGD Goal: Cardiac Rehab if ordered Outcome: Not Applicable Date Met:  03/25/12 Not ordered, no significant coronary artery disease, GI consult post cath

## 2012-03-25 NOTE — Discharge Summary (Signed)
Discharge Summary   Patient ID: Richard Rocha MRN: [id number], DOB/AGE: 10/03/1940 71 y.o.  Primary MD: Kirstie Peri, MD Primary Cardiologist: None Admit date: 03/23/2012 D/C date:     03/25/2012      Primary Discharge Diagnoses:  1. Chest pain, Noncardiac   - Cath without significant CAD   2. Gastritis and duodenitis  - Abd [place] without acute findings  - EGD revealed mild gastritis and duodenitis, Schatzki's ring at the gastroesophageal junction, and hiatal hernia   - Initiated on Protonix, f/u w/ GI  Secondary Discharge Diagnoses:  1. Diabetes mellitus type II   2. Essential hypertension   3. Dyslipidemia  4. Tobacco Abuse 5. Inguinal hernia repair bilateral   6. Shoulder surgery right   7. Knee surgery left arthroscopy   8. Cholecystectomy    Allergies Allergies  Allergen Reactions  . Benadryl (Diphenhydramine Hcl) Other (See Comments)    Makes patient feel very ill   . Morphine And Related Other (See Comments)    hallucinations  . Scopolamine Hbr Other (See Comments)    Body shuts down    Diagnostic Studies/Procedures:   03/23/12 - Cardiac Cath Coronary Arteries:  Right dominant with no anomalies  LM: Normal  LAD: 30% ostial stenosis smooth. Normal mid and distal vessel  D1: normal and small  D2 normal and large  Circumflex: left dominant normal  OM1: normal  OM2: nomral  RCA: small nondominant and normal  PDA: left sided normal  PLA: left sided and normal  Ventriculography: EF: 65 %, No RWMA;s  Hemodynamics:  Aortic Pressure: 157 73 mmHg LV Pressure: 157 7 mmHg  Impression: No signifcant CAD.  03/24/2012 - US Abdomen Complete Findings:  Gallbladder:  Surgically absent.  Common bile duct:  Measures 4.5 mm.  Liver:  No focal lesion identified.  At the upper limits of normal for parenchymal echogenicity.  IVC:  Appears normal.  Pancreas:  Not visualized due to overlying bowel gas.  Spleen:  Measures 7.4 cm.  Right Kidney:  Measures 11.1 cm.  No mass or  hydronephrosis.  Left Kidney:  Measures 11.2 cm.  No mass or hydronephrosis.  Abdominal aorta:  No aneurysm identified.  IMPRESSION: Status post cholecystectomy.  Otherwise negative abdominal ultrasound  03/25/12 - EGD ENDOSCOPIC IMPRESSION:  1) Schatzki's ring at the gastroesophageal junction. Nonobstructing. Biopsies performed and sent to pathology  2) Otherwise normal esophagus  3) Hiatal hernia  4) Mild gastritis in the antrum. Biopsies performed to exclude H. pylori  5) Mild duodenitis in the bulb of duodenum  6) Normal in the second portion duodenum  RECOMMENDATIONS:  1) Await pathology results  2) Follow-up of helicobacter pylori status, treat if indicated  3) Given mild inflammation seen in the distal stomach and proximal duodenum, recommend daily PPI. He will follow in clinic to insure improvement.  4) If in the future dysphagia becomes an issue, esophageal dilation can be performed.    History of Present Illness: 71 y.o. male w/ the above medical problems who presented to [facility] on 03/23/12 for planned cardiac catheterization.  He was seen in clinic on 03/16/12 for evaluation of chest pain and history of stress test in April that was inconclusive with inferior and mid anterior defect consistent with attenuation. Given that obstructive coronary disease could not be definitively excluded plans were made for further ischemic evaluation.  Hospital Course: He presented to [facility] on 03/23/12 for cardiac catheterization which revealed no significant CAD. He tolerated the procedure well without  complications. There was no evidence of a cardiac etiology to his pain. Hepatic panel, lipase, and amylase were within normal limits. He was evaluated by gastroenterology who recommended abd [place] and EGD. Abdominal ultrasound was without acute findings. EGD revealed gastritis and duodenitis. It was recommended he be initiated on daily a PPI which was prescribed at discharge. He will  follow up with GI as an outpatient to assess for improvement and to follow up on results of biopsies.   He was seen and evaluated by Dr. Antoine Poche who felt he was stable for discharge home with plans for follow up as scheduled below.  Discharge Vitals: Blood pressure 122/78, pulse 54, temperature 97.1 F (36.2 C), temperature source Oral, resp. rate 20, height 5\' 7"  (1.702 m), weight 192 lb 9.6 oz (87.363 kg), SpO2 96.00%.  Labs: Component Value Date   WBC 11.0* 03/24/2012   HGB 14.5 03/24/2012   HCT 43.2 03/24/2012   MCV 89.6 03/24/2012   PLT 167 03/24/2012    Lab 03/24/12 0515 03/23/12 1600  NA 139 --  K 4.1 --  CL 104 --  CO2 24 --  BUN 13 --  CREATININE 0.83 --  CALCIUM 9.0 --  PROT -- 6.5  BILITOT -- 0.4  ALKPHOS -- 62  ALT -- 11  AST -- 13  GLUCOSE 133* --   Component Value Date   CHOL 148 03/24/2012   HDL 31* 03/24/2012   LDLCALC 70 03/24/2012   TRIG 237* 03/24/2012      Discharge Medications   Medication List  As of 03/25/2012  4:57 PM   STOP taking these medications         omeprazole 20 MG capsule         TAKE these medications         calcium carbonate 500 MG chewable tablet   Commonly known as: TUMS - dosed in mg elemental calcium   Chew 1 tablet by mouth as needed. For indigestion      enalapril 10 MG tablet   Commonly known as: VASOTEC   Take 10 mg by mouth daily.      metFORMIN 850 MG tablet   Commonly known as: GLUCOPHAGE   Take 1 tablet (850 mg total) by mouth daily. Please hold 48hrs after your heart procedure. May resume on 03/26/12.      multivitamin with minerals Tabs   Take 1 tablet by mouth daily.      pantoprazole 40 MG tablet   Commonly known as: PROTONIX   Take 1 tablet (40 mg total) by mouth daily before breakfast.      simvastatin 40 MG tablet   Commonly known as: ZOCOR   Take 40 mg by mouth every evening.            Disposition   Discharge Orders    Future Appointments: Provider: Department: Dept Phone: Center:    04/29/2012 10:30 AM Beverley Fiedler, MD [facility] [phone] [facility]     Future Orders Please Complete By Expires   Diet - low sodium heart healthy      Increase activity slowly      Discharge instructions      Comments:   **PLEASE REMEMBER TO BRING ALL OF YOUR MEDICATIONS TO EACH OF YOUR FOLLOW-UP OFFICE VISITS.  * KEEP GROIN SITE CLEAN AND DRY. Call the office for any signs of bleedings, pus, swelling, increased pain, or any other concerns. * NO HEAVY LIFTING (>10lbs) OR SEXUAL ACTIVITY X 6 DAYS. *  NO DRIVING X 1-2 DAYS. * NO SOAKING BATHS, HOT TUBS, POOLS, ETC., X 6 DAYS.  * Please stop smoking!!! * Please take your Protonix (anti-reflux) medication every day. * Please hold your metformin 48hrs after your heart procedure. May resume on 03/26/12.     Follow-up Information    Follow up with Beverley Fiedler, MD on 04/29/2012. (at 10:30 am-  3rd floor [facility]  [address])    Contact information:   [address] [place] [address] [phone]       Follow up with [facility], MD. (As needed)    Contact information:   [address]  [place] [address] [phone]           Outstanding Labs/Studies:  None  Duration of Discharge Encounter: Greater than 30 minutes including physician and PA time.  Signed, HOPE, JESSICA PA-C 03/25/2012, 4:57 PM  Patient seen and examined.  Plan as discussed in my rounding note for today and outlined above. Levelle Edelen  03/25/2012  5:14 PM

## 2012-03-25 NOTE — Op Note (Signed)
[facility] [address] [place], [place]  [address]  ENDOSCOPY PROCEDURE REPORT  PATIENT:  Richard Rocha, Richard Rocha  MR#:  [id number] BIRTHDATE:  1941/04/25, 70 yrs. old  GENDER:  male ENDOSCOPIST:  Carie Caddy. Jhaniya Briski, MD Referred by:  Rollene Rotunda, M.D. PROCEDURE DATE:  03/25/2012 PROCEDURE:  EGD with biopsy, 43239 ASA CLASS:  Class II INDICATIONS:  atypical chest pain MEDICATIONS:   Fentanyl 50 mcg IV, Versed 7 mg IV TOPICAL ANESTHETIC:  Cetacaine Spray  DESCRIPTION OF PROCEDURE:   After the risks benefits and alternatives of the procedure were thoroughly explained, informed consent was obtained.  The Pentax Gastroscope [id number] endoscope was introduced through the mouth and advanced to the second portion of the duodenum, without limitations.  The instrument was slowly withdrawn as the mucosa was fully examined. <<PROCEDUREIMAGES>>  A non-obstructing Schatzki's ring was found at the gastroesophageal junction, located at 37 cm from the incisors. Dilation was not performed due to the lack of dysphagia symptoms. Multiple biopsies of the esophageal ring were obtained and sent to pathology.  Otherwise normal esophagus.  A 4 cm hiatal hernia was found.  Mild gastritis, characterized by erythema, was found antrum. Biopsies of the antrum and body of the stomach were obtained and sent to pathology.  Mild duodenitis, characterized by erythema and erosion, was found in the bulb of the duodenum. Normal otherwise in the second portion of the duodenum. Retroflexed views revealed findings as previously described.    The scope was then withdrawn from the patient and the procedure completed. COMPLICATIONS:  None  ENDOSCOPIC IMPRESSION: 1) Schatzki's ring at the gastroesophageal junction. Nonobstructing. Biopsies performed and sent to pathology 2) Otherwise normal esophagus 3) Hiatal hernia 4) Mild gastritis in the antrum. Biopsies performed to exclude H. pylori 5) Mild  duodenitis in the bulb of duodenum 6) Normal in the second portion duodenum  RECOMMENDATIONS: 1) Await pathology results 2)  Follow-up of helicobacter pylori status, treat if indicated  3) Given mild inflammation seen in the distal stomach and proximal duodenum, recommend daily PPI. He will follow in clinic to insure improvement. 4) If in the future dysphagia becomes an issue, esophageal dilation can be performed.  Carie Caddy. Rhea Belton, MD  CC:  The Patient Rollene Rotunda, MD  n. eSIGNEDCarie Caddy. Rocio Wolak at 03/25/2012 01:10 PM  Lewis Moccasin, [phone]

## 2012-03-25 NOTE — Plan of Care (Signed)
Problem: Phase III Progression Outcomes Goal: Discharge plan remains appropriate-arrangements made Outcome: Completed/Met Date Met:  03/25/12 Discharge home today  Problem: Discharge Progression Outcomes Goal: Discharge plan in place and appropriate Outcome: Completed/Met Date Met:  03/25/12 Discharge home today after EGD as planned Goal: Pain controlled with appropriate interventions Outcome: Completed/Met Date Met:  03/25/12 Painfree Goal: Hemodynamically stable Outcome: Completed/Met Date Met:  03/25/12 VSS Goal: Ambulates up to 600 ft. in hall x 1 Outcome: Completed/Met Date Met:  03/25/12 Ambulating in hall ad lib and tolerating well with no shortness of breath or chest pain. Goal: Tolerating diet Outcome: Completed/Met Date Met:  03/25/12 Tolerated lunch with no problems ate 100 % Goal: Activity appropriate for discharge plan Outcome: Completed/Met Date Met:  03/25/12 Ambulating in hall Goal: Vascular site scale level 0 - I Vascular Site Scale Level 0: No bruising/bleeding/hematoma Level I (Mild): Bruising/Ecchymosis, minimal bleeding/ooozing, palpable hematoma < 3 cm Level II (Moderate): Bleeding not affecting hemodynamic parameters, pseudoaneurysm, palpable hematoma > 3 cm  Outcome: Completed/Met Date Met:  03/25/12 Level 0 vascular site

## 2012-03-25 NOTE — Progress Notes (Signed)
Agree. Beverley Fiedler, MD

## 2012-03-25 NOTE — Progress Notes (Signed)
Patient ID: Richard Rocha, male   DOB: 02-17-1941, 71 y.o.   MRN: [id number]   Abdominal US negative-pancreas not seen  EGD- mild gastritis and duodenitis  Pt may be discharged from Gi standpoint- he should stay on Protonix 40 mg daily , every day.. Office follow up with Dr. Rhea Belton on 04/29/2012 at 10:30 am

## 2012-03-29 ENCOUNTER — Encounter: Payer: Self-pay | Admitting: Internal Medicine

## 2012-03-29 DIAGNOSIS — K2270 Barrett's esophagus without dysplasia: Secondary | ICD-10-CM | POA: Insufficient documentation

## 2012-03-30 ENCOUNTER — Encounter (HOSPITAL_COMMUNITY): Payer: Self-pay | Admitting: Internal Medicine

## 2012-03-31 ENCOUNTER — Telehealth: Payer: Self-pay | Admitting: *Deleted

## 2012-03-31 NOTE — Telephone Encounter (Signed)
Also patient needs repeat EGD in 6 months for Barrett's esophagus surveillance.      Letter mailed, recall and reminder note in.

## 2012-04-28 ENCOUNTER — Encounter: Payer: Self-pay | Admitting: Internal Medicine

## 2012-04-29 ENCOUNTER — Ambulatory Visit: Payer: PRIVATE HEALTH INSURANCE | Admitting: Internal Medicine

## 2012-07-27 ENCOUNTER — Other Ambulatory Visit (HOSPITAL_COMMUNITY): Payer: Self-pay | Admitting: Cardiology

## 2012-07-28 ENCOUNTER — Telehealth: Payer: Self-pay | Admitting: Cardiology

## 2012-07-28 MED ORDER — PANTOPRAZOLE SODIUM 40 MG PO TBEC: 40.0000 mg | DELAYED_RELEASE_TABLET | Freq: Every day | ORAL | Status: DC

## 2012-07-28 NOTE — Telephone Encounter (Signed)
Pt needs refill on protonix and Pharm has been confirmed

## 2012-08-28 ENCOUNTER — Encounter: Payer: Self-pay | Admitting: Internal Medicine

## 2013-04-05 ENCOUNTER — Encounter: Payer: Self-pay | Admitting: Internal Medicine

## 2014-08-18 ENCOUNTER — Encounter (HOSPITAL_COMMUNITY): Payer: Self-pay | Admitting: Cardiovascular Disease

## 2015-04-25 ENCOUNTER — Encounter: Payer: Self-pay | Admitting: Cardiovascular Disease

## 2015-05-04 ENCOUNTER — Ambulatory Visit (INDEPENDENT_AMBULATORY_CARE_PROVIDER_SITE_OTHER): Payer: Medicare Other | Admitting: Cardiovascular Disease

## 2015-05-04 ENCOUNTER — Encounter: Payer: Self-pay | Admitting: Cardiovascular Disease

## 2015-05-04 VITALS — BP 145/87 | HR 60 | Ht 67.0 in | Wt 168.0 lb

## 2015-05-04 DIAGNOSIS — E785 Hyperlipidemia, unspecified: Secondary | ICD-10-CM

## 2015-05-04 DIAGNOSIS — R55 Syncope and collapse: Secondary | ICD-10-CM | POA: Diagnosis not present

## 2015-05-04 DIAGNOSIS — R42 Dizziness and giddiness: Secondary | ICD-10-CM

## 2015-05-04 DIAGNOSIS — R61 Generalized hyperhidrosis: Secondary | ICD-10-CM | POA: Diagnosis not present

## 2015-05-04 DIAGNOSIS — I10 Essential (primary) hypertension: Secondary | ICD-10-CM | POA: Diagnosis not present

## 2015-05-04 NOTE — Patient Instructions (Signed)
Your physician has recommended that you wear a 30 day event monitor. Event monitors are medical devices that record the heart's electrical activity. Doctors most often [place] these monitors to diagnose arrhythmias. Arrhythmias are problems with the speed or rhythm of the heartbeat. The monitor is a small, portable device. You can wear one while you do your normal daily activities. This is usually used to diagnose what is causing palpitations/syncope (passing out). Office will contact with results via phone or letter.   Continue all current medications. Follow up in  2-3 months

## 2015-05-04 NOTE — Progress Notes (Signed)
Patient ID: Richard Rocha, male   DOB: 02-02-1941, 74 y.o.   MRN: [id number]       CARDIOLOGY CONSULT NOTE  Patient ID: Richard Rocha MRN: [id number] DOB/AGE: May 19, 1941 74 y.o.  Admit date: (Not on file) Primary Physician Monico Blitz, MD  Reason for Consultation: dizziness, diaphoresis  HPI: The patient is a 74 year old male with a history of hypertension , diabetes, dyslipidemia , Schatzki's ring, and Barrett's esophagus who is referred for the evaluation of dizziness and sweating. He underwent coronary angiography in July 2013 which demonstrated mild nonobstructive CAD, with a 30% LAD ostial stenosis and normal left ventricular systolic function, LVEF 77%, with no regional wall motion abnormalities on left ventriculography.  ECG performed on 04/25/15 demonstrated sinus bradycardia with a nonspecific T wave abnormality in aVL , heart rate 55 bpm. Sinus arrhythmia was also noted.  Approximately 2 weeks ago , he became dizzy , clammy, hot, and sweaty and nearly passed out on his bed but never lost consciousness. He continued to have nausea for the rest of the evening and then his symptoms resolved. He did take a meclizine tablet. He has a history of vertigo and experiences dizziness approximately once a week but he says "it's not that bad". This has been going on for years. He says he seldom has chest pains and shortness of breath. He also has palpitations in the morning "once in a while".  Allergies  Allergen Reactions  . Morphine And Related Other (See Comments)    hallucinations  . Scopolamine Hbr Other (See Comments)    Body shuts down    Current Outpatient Prescriptions  Medication Sig Dispense Refill  . aspirin EC 81 MG tablet Take 81 mg by mouth daily.    . enalapril (VASOTEC) 10 MG tablet Take 10 mg by mouth daily.    . meclizine (ANTIVERT) 25 MG tablet Take 1 tablet by mouth 3 (three) times daily as needed.  4  . metFORMIN (GLUCOPHAGE) 500 MG tablet Take 500 mg by mouth  3 (three) times daily.  5  . Multiple Vitamin (MULTIVITAMIN WITH MINERALS) TABS Take 1 tablet by mouth daily.    . simvastatin (ZOCOR) 40 MG tablet Take 40 mg by mouth every evening.     No current facility-administered medications for this visit.    Past Medical History  Diagnosis Date  . Diabetes mellitus type II   . Essential hypertension   . Dyslipidemia   . Chest pain     NO significant CAD by cath 03/23/12  . Gastritis and duodenitis     EGD 03/2012  . Schatzki's ring   . Hiatal hernia   . Tobacco abuse     Past Surgical History  Procedure Laterality Date  . Inguinal hernia repair      bilateral  . Shoulder surgery      right  . Knee surgery      left arthroscopy  . Cholecystectomy    . Joint replacement      ltknee arthroscopic,rt shoulder arthroscopic  . Esophagogastroduodenoscopy  03/25/2012    Procedure: ESOPHAGOGASTRODUODENOSCOPY (EGD);  Surgeon: [name], MD;  Location: [facility];  Service: Gastroenterology;  Laterality: N/A;  . Left heart catheterization with coronary angiogram N/A 03/23/2012    Procedure: LEFT HEART CATHETERIZATION WITH CORONARY ANGIOGRAM;  Surgeon: [name], MD;  Location: [facility] CATH LAB;  Service: Cardiovascular;  Laterality: N/A;    Social History   Social History  . Marital Status: Married    Spouse  Name: N/A  . Number of Children: 3  . Years of Education: N/A   Occupational History  . Truck driving (part time)    Social History Main Topics  . Smoking status: Current Every Day Smoker -- 1.00 packs/day for 60 years    Types: Cigarettes    Start date: 08/23/1959  . Smokeless tobacco: Never Used     Comment: He has quit many times.  . Alcohol Use: 0.6 oz/week    1 Shots of liquor per week     Comment: once in a while  . Drug Use: No  . Sexual Activity: Yes    Birth Control/ Protection: None   Other Topics Concern  . Not on file   Social History Narrative   Lives with wife.       No family history of premature  CAD in 1st degree relatives.  Prior to Admission medications   Medication Sig Start Date End Date Taking? Authorizing Provider  aspirin EC 81 MG tablet Take 81 mg by mouth daily.   Yes Historical Provider, MD  enalapril (VASOTEC) 10 MG tablet Take 10 mg by mouth daily.   Yes Historical Provider, MD  meclizine (ANTIVERT) 25 MG tablet Take 1 tablet by mouth 3 (three) times daily as needed. 03/25/15  Yes Historical Provider, MD  metFORMIN (GLUCOPHAGE) 500 MG tablet Take 500 mg by mouth 3 (three) times daily. 02/15/15  Yes Historical Provider, MD  Multiple Vitamin (MULTIVITAMIN WITH MINERALS) TABS Take 1 tablet by mouth daily.   Yes Historical Provider, MD  simvastatin (ZOCOR) 40 MG tablet Take 40 mg by mouth every evening.   Yes Historical Provider, MD     Review of systems complete and found to be negative unless listed above in HPI     Physical exam Blood pressure 145/87, pulse 60, height 5\' 7"  (1.702 m), weight 168 lb (76.204 kg). General: NAD Neck: No JVD, no thyromegaly or thyroid nodule.  Lungs: Clear to auscultation bilaterally with normal respiratory effort. CV: Nondisplaced PMI. Regular rate and rhythm, normal S1/S2, no S3/S4, no murmur.  No peripheral edema.  No carotid bruit.  Normal pedal pulses.  Abdomen: Soft, nontender, no hepatosplenomegaly, no distention.  Skin: Intact without lesions or rashes.  Neurologic: Alert and oriented x 3.  Psych: Normal affect. Extremities: No clubbing or cyanosis.  HEENT: Normal.   ECG: Most recent ECG reviewed.  Labs:   Lab Results  Component Value Date   WBC 11.0* 03/24/2012   HGB 14.5 03/24/2012   HCT 43.2 03/24/2012   MCV 89.6 03/24/2012   PLT 167 03/24/2012   No results for input(s): NA, K, CL, CO2, BUN, CREATININE, CALCIUM, PROT, BILITOT, ALKPHOS, ALT, AST, GLUCOSE in the last 168 hours.  Invalid input(s): LABALBU No results found for: CKTOTAL, CKMB, CKMBINDEX, TROPONINI  Lab Results  Component Value Date   CHOL 148  03/24/2012   Lab Results  Component Value Date   HDL 31* 03/24/2012   Lab Results  Component Value Date   LDLCALC 70 03/24/2012   Lab Results  Component Value Date   TRIG 237* 03/24/2012   Lab Results  Component Value Date   CHOLHDL 4.8 03/24/2012   No results found for: LDLDIRECT       Studies: No results found.  ASSESSMENT AND PLAN:  1. Near syncope, dizziness, and diaphoresis: While he has vertigo, this most recent episode was the worst episode of dizziness he has experienced. We spoke at length about watchful waiting vs event monitoring.  He prefers to proceed with event monitoring "to be on the safe side". I will obtain a 30-day event monitor to evaluate for bradyarrhythmias and pauses.  2. Essential HTN: Mildly elevated. Will monitor.  3. Dyslipidemia: On simvastatin.  Dispo: f/u 2-3 months.   Signed: Kate Sable, M.D., F.A.C.C.  05/04/2015, 10:50 AM

## 2015-05-10 ENCOUNTER — Telehealth: Payer: Self-pay | Admitting: Cardiovascular Disease

## 2015-05-10 MED ORDER — NITROGLYCERIN 0.4 MG SL SUBL: 0.4000 mg | SUBLINGUAL_TABLET | SUBLINGUAL | Status: AC | PRN

## 2015-05-10 NOTE — Telephone Encounter (Signed)
Patient's wife stated patient had some chest pain and other symptoms last night.  Would like to speak with nurse in regards to what he needs to do.  Also states that patient has not yet received event monitor. / tg

## 2015-05-10 NOTE — Telephone Encounter (Signed)
Patient notified.  Instructions given on how to use the SL Nitro.  He verbalized understanding.

## 2015-05-10 NOTE — Telephone Encounter (Signed)
Prescribe SL nitroglycerin prn for chest pain.

## 2015-05-10 NOTE — Telephone Encounter (Signed)
Chest pain off/on through last night.  Did not go to ED.  Finally quit on its own.  States chest just feels sore today, but feels lousy.  Did take ASA at 6:00 this morning.   No SOB.  Occasionally does have dizziness, but does have vertigo.  Does not have Nitroglycerin.  Stated that his monitor should be in about 2-3 days.

## 2015-05-10 NOTE — Telephone Encounter (Signed)
Patient's wife called back about her message from this morning

## 2015-05-12 ENCOUNTER — Ambulatory Visit (INDEPENDENT_AMBULATORY_CARE_PROVIDER_SITE_OTHER): Payer: Medicare Other

## 2015-05-12 DIAGNOSIS — R55 Syncope and collapse: Secondary | ICD-10-CM | POA: Diagnosis not present

## 2015-08-07 ENCOUNTER — Encounter: Payer: Self-pay | Admitting: Cardiovascular Disease

## 2015-08-07 ENCOUNTER — Ambulatory Visit (INDEPENDENT_AMBULATORY_CARE_PROVIDER_SITE_OTHER): Payer: Medicare Other | Admitting: Cardiovascular Disease

## 2015-08-07 VITALS — BP 140/80 | HR 62 | Ht 67.0 in | Wt 174.0 lb

## 2015-08-07 DIAGNOSIS — R55 Syncope and collapse: Secondary | ICD-10-CM | POA: Diagnosis not present

## 2015-08-07 DIAGNOSIS — R079 Chest pain, unspecified: Secondary | ICD-10-CM

## 2015-08-07 DIAGNOSIS — R42 Dizziness and giddiness: Secondary | ICD-10-CM

## 2015-08-07 DIAGNOSIS — I10 Essential (primary) hypertension: Secondary | ICD-10-CM

## 2015-08-07 DIAGNOSIS — E785 Hyperlipidemia, unspecified: Secondary | ICD-10-CM

## 2015-08-07 NOTE — Progress Notes (Signed)
Patient ID: Richard Rocha, male   DOB: 01/17/1941, 74 y.o.   MRN: [id number]      SUBJECTIVE: The patient presents for follow-up of near syncope, dizziness, and chest pain. 30 day event monitoring demonstrated sinus rhythm and sinus arrhythmia. I prescribed sublingual nitroglycerin as needed for chest pain.  He underwent coronary angiography in July 2013 which demonstrated mild nonobstructive CAD, with a 30% LAD ostial stenosis and normal left ventricular systolic function, LVEF 123456, with no regional wall motion abnormalities on left ventriculography.  He has had to use nitroglycerin once since his last appointment with me. He has episodic dizziness associated with vertigo. He denies near syncope.   Review of Systems: As per "subjective", otherwise negative.  Allergies  Allergen Reactions  . Morphine And Related Other (See Comments)    hallucinations  . Scopolamine Hbr Other (See Comments)    Body shuts down    Current Outpatient Prescriptions  Medication Sig Dispense Refill  . aspirin EC 81 MG tablet Take 81 mg by mouth daily.    . enalapril (VASOTEC) 10 MG tablet Take 10 mg by mouth daily.    . meclizine (ANTIVERT) 25 MG tablet Take 1 tablet by mouth 3 (three) times daily as needed.  4  . metFORMIN (GLUCOPHAGE) 500 MG tablet Take 500 mg by mouth 3 (three) times daily.  5  . Multiple Vitamin (MULTIVITAMIN WITH MINERALS) TABS Take 1 tablet by mouth daily.    . nitroGLYCERIN (NITROSTAT) 0.4 MG SL tablet Place 1 tablet (0.4 mg total) under the tongue every 5 (five) minutes as needed for chest pain. (Not to exceed 3 in a 15 minute time frame.  If no relief after second dose, call 911. 25 tablet 3  . simvastatin (ZOCOR) 40 MG tablet Take 40 mg by mouth every evening.     No current facility-administered medications for this visit.    Past Medical History  Diagnosis Date  . Diabetes mellitus type II   . Essential hypertension   . Dyslipidemia   . Chest pain     NO significant CAD  by cath 03/23/12  . Gastritis and duodenitis     EGD 03/2012  . Schatzki's ring   . Hiatal hernia   . Tobacco abuse     Past Surgical History  Procedure Laterality Date  . Inguinal hernia repair      bilateral  . Shoulder surgery      right  . Knee surgery      left arthroscopy  . Cholecystectomy    . Joint replacement      ltknee arthroscopic,rt shoulder arthroscopic  . Esophagogastroduodenoscopy  03/25/2012    Procedure: ESOPHAGOGASTRODUODENOSCOPY (EGD);  Surgeon: [name], MD;  Location: [place];  Service: Gastroenterology;  Laterality: N/A;  . Left heart catheterization with coronary angiogram N/A 03/23/2012    Procedure: LEFT HEART CATHETERIZATION WITH CORONARY ANGIOGRAM;  Surgeon: [name], MD;  [facility];  Service: Cardiovascular;  Laterality: N/A;    Social History   Social History  . Marital Status: Married    Spouse Name: N/A  . Number of Children: 3  . Years of Education: N/A   Occupational History  . Truck driving (part time)    Social History Main Topics  . Smoking status: Current Every Day Smoker -- 1.00 packs/day for 60 years    Types: Cigarettes    Start date: 08/23/1959  . Smokeless tobacco: Never Used     Comment: He has quit  many times.  . Alcohol Use: 0.6 oz/week    1 Shots of liquor per week     Comment: once in a while  . Drug Use: No  . Sexual Activity: Yes    Birth Control/ Protection: None   Other Topics Concern  . Not on file   Social History Narrative   Lives with wife.       Filed Vitals:   08/07/15 0857  BP: 140/80  Pulse: 62  Height: 5\' 7"  (1.702 m)  Weight: 174 lb (78.926 kg)  SpO2: 99%    PHYSICAL EXAM General: NAD HEENT: Normal. Neck: No JVD, no thyromegaly. Lungs: Faint, intermittent expiratory wheezes, otherwise clear. CV: Nondisplaced PMI.  Regular rate and rhythm, normal S1/S2, no S3/S4, no murmur. No pretibial or periankle edema.    Abdomen: Soft, nontender, no distention.    Neurologic: Alert and oriented x 3.  Psych: Normal affect. Skin: Normal. Musculoskeletal: No gross deformities. Extremities: No clubbing or cyanosis.   ECG: Most recent ECG reviewed.      ASSESSMENT AND PLAN: 1. Near syncope: No arrhythmias with event monitoring. No recurrences.  2. Chest pain: Symptomatically stable. Continue SL nitroglycerin prn.  3. Essential HTN: Borderline elevated. Will monitor.  4. Dyslipidemia: On statin therapy.  Dispo: f/u prn.   Kate Sable, M.D., F.A.C.C.

## 2015-08-07 NOTE — Patient Instructions (Signed)
Your physician recommends that you schedule a follow-up appointment AS NEEDED WITH DR. KONESWARAN  Your physician recommends that you continue on your current medications as directed. Please refer to the Current Medication list given to you today.  Thank you for choosing [facility]!!   

## 2016-06-21 ENCOUNTER — Encounter: Payer: Self-pay | Admitting: Cardiovascular Disease

## 2016-06-21 ENCOUNTER — Ambulatory Visit (INDEPENDENT_AMBULATORY_CARE_PROVIDER_SITE_OTHER): Payer: Medicare Other | Admitting: Cardiovascular Disease

## 2016-06-21 VITALS — BP 142/82 | HR 52 | Ht 67.0 in | Wt 166.6 lb

## 2016-06-21 DIAGNOSIS — R55 Syncope and collapse: Secondary | ICD-10-CM

## 2016-06-21 DIAGNOSIS — Z9289 Personal history of other medical treatment: Secondary | ICD-10-CM

## 2016-06-21 DIAGNOSIS — R0789 Other chest pain: Secondary | ICD-10-CM

## 2016-06-21 DIAGNOSIS — I10 Essential (primary) hypertension: Secondary | ICD-10-CM

## 2016-06-21 DIAGNOSIS — R42 Dizziness and giddiness: Secondary | ICD-10-CM | POA: Diagnosis not present

## 2016-06-21 NOTE — Progress Notes (Signed)
SUBJECTIVE: The patient presents for follow-up of chest pain. He underwent coronary angiography in July 2013 which demonstrated mild nonobstructive CAD, with a 30% LAD ostial stenosis and normal left ventricular systolic function, LVEF 123456, with no regional wall motion abnormalities on left ventriculography. He has diabetes and hypertension.  Recent ECG at PCPs office showed sinus bradycardia, heart rate 54 bpm.  ECG performed in the office today demonstrates sinus bradycardia, heart rate 52 bpm. There are no ischemic ST segment or T-wave abnormalities.   He was recently evaluated at [facility] for chest pain on 10/9. I reviewed labs. Troponin was normal. He said he has a lot of stress at home. He continues to drive a truck. He has had chest pain 4 times in the past year requiring nitroglycerin.  Review of Systems: As per "subjective", otherwise negative.  Allergies  Allergen Reactions  . Morphine And Related Other (See Comments)    hallucinations  . Scopolamine Hbr Other (See Comments)    Body shuts down    Current Outpatient Prescriptions  Medication Sig Dispense Refill  . aspirin EC 81 MG tablet Take 81 mg by mouth daily.    . enalapril (VASOTEC) 10 MG tablet Take 10 mg by mouth daily.    . metFORMIN (GLUCOPHAGE) 500 MG tablet Take 500 mg by mouth 3 (three) times daily.  5  . Multiple Vitamin (MULTIVITAMIN WITH MINERALS) TABS Take 1 tablet by mouth daily.    . nitroGLYCERIN (NITROSTAT) 0.4 MG SL tablet Place 1 tablet (0.4 mg total) under the tongue every 5 (five) minutes as needed for chest pain. (Not to exceed 3 in a 15 minute time frame.  If no relief after second dose, call 911. 25 tablet 3  . simvastatin (ZOCOR) 40 MG tablet Take 40 mg by mouth every evening.    . triamcinolone cream (KENALOG) 0.1 % Apply 1 application topically daily as needed for rash.     No current facility-administered medications for this visit.     Past Medical History:  Diagnosis Date    . Chest pain    NO significant CAD by cath 03/23/12  . Diabetes mellitus type II   . Dyslipidemia   . Essential hypertension   . Gastritis and duodenitis    EGD 03/2012  . Hiatal hernia   . Schatzki's ring   . Tobacco abuse     Past Surgical History:  Procedure Laterality Date  . CHOLECYSTECTOMY    . ESOPHAGOGASTRODUODENOSCOPY  03/25/2012   Procedure: ESOPHAGOGASTRODUODENOSCOPY (EGD);  Surgeon: [name], MD;  Location: [place];  Service: Gastroenterology;  Laterality: N/A;  . INGUINAL HERNIA REPAIR     bilateral  . JOINT REPLACEMENT     ltknee arthroscopic,rt shoulder arthroscopic  . KNEE SURGERY     left arthroscopy  . LEFT HEART CATHETERIZATION WITH CORONARY ANGIOGRAM N/A 03/23/2012   Procedure: LEFT HEART CATHETERIZATION WITH CORONARY ANGIOGRAM;  Surgeon: [name], MD;  Location: [facility] CATH LAB;  Service: Cardiovascular;  Laterality: N/A;  . SHOULDER SURGERY     right    Social History   Social History  . Marital status: Married    Spouse name: N/A  . Number of children: 3  . Years of education: N/A   Occupational History  . Truck driving (part time) [employer]   Social History Main Topics  . Smoking status: Current Every Day Smoker    Packs/day: 1.00    Years: 60.00    Types: Cigarettes  Start date: 08/23/1959  . Smokeless tobacco: Never Used     Comment: He has quit many times.  . Alcohol use 0.6 oz/week    1 Shots of liquor per week     Comment: once in a while  . Drug use: No  . Sexual activity: Yes    Birth control/ protection: None   Other Topics Concern  . Not on file   Social History Narrative   Lives with wife.       Vitals:   06/21/16 1448  BP: (!) 142/82  Pulse: (!) 52  SpO2: 98%  Weight: 166 lb 9.6 oz (75.6 kg)  Height: 5\' 7"  (1.702 m)    PHYSICAL EXAM General: NAD HEENT: Normal. Neck: No JVD, no thyromegaly. Lungs: Clear to auscultation bilaterally with normal respiratory effort. CV: Nondisplaced PMI.   Bradycardic, regular rhythm, normal S1/S2, no S3/S4, no murmur. No pretibial or periankle edema.    Abdomen: Soft, nontender, no distention.  Neurologic: Alert and oriented.  Psych: Normal affect. Skin: Normal. Musculoskeletal: No gross deformities.    ECG: Most recent ECG reviewed.      ASSESSMENT AND PLAN: 1. Near syncope: No arrhythmias with event monitoring in past. No recurrences.  2. Chest pain: Symptomatically stable. Doubtful that recent episode was cardiac, more likely anxiety/stress. Continue SL nitroglycerin prn.  3. Essential HTN: Borderline elevated. Will monitor.  4. Dyslipidemia: On statin therapy.  Dispo: f/u prn.   Kate Sable, M.D., F.A.C.C.

## 2016-06-21 NOTE — Patient Instructions (Signed)
Medication Instructions:  Continue all current medications.  Labwork: none  Testing/Procedures: none  Follow-Up: As needed.    Any Other Special Instructions Will Be Listed Below (If Applicable).  If you need a refill on your cardiac medications before your next appointment, please call your pharmacy.  

## 2017-04-08 ENCOUNTER — Telehealth: Payer: Self-pay

## 2017-04-08 NOTE — Telephone Encounter (Signed)
I called pt and he was referred By [facility]. Has hx of tubular adenomas. Ov with Walden Field, NP on 05/28/2017 at 8:30 Am.

## 2017-04-08 NOTE — Telephone Encounter (Signed)
[phone] patient received letter to schedule tcs

## 2017-04-08 NOTE — Telephone Encounter (Signed)
[facility] for [facility] @ [phone], the appt time and to please fax me the Authorization number.

## 2017-04-15 NOTE — Telephone Encounter (Signed)
Received the Authorization # [id number].

## 2017-05-28 ENCOUNTER — Ambulatory Visit: Payer: Medicare Other | Admitting: Nurse Practitioner

## 2018-04-08 ENCOUNTER — Encounter: Payer: Self-pay | Admitting: Nurse Practitioner

## 2018-07-03 ENCOUNTER — Encounter (HOSPITAL_COMMUNITY): Payer: Self-pay

## 2018-07-03 ENCOUNTER — Other Ambulatory Visit: Payer: Self-pay

## 2018-07-03 ENCOUNTER — Emergency Department (HOSPITAL_COMMUNITY)
Admission: EM | Admit: 2018-07-03 | Discharge: 2018-07-03 | Disposition: A | Discharge status: Home / Self Care | Payer: Medicare Other | Attending: Emergency Medicine | Admitting: Emergency Medicine

## 2018-07-03 ENCOUNTER — Emergency Department (HOSPITAL_COMMUNITY): Payer: Medicare Other

## 2018-07-03 DIAGNOSIS — R059 Cough, unspecified: Secondary | ICD-10-CM

## 2018-07-03 DIAGNOSIS — J069 Acute upper respiratory infection, unspecified: Secondary | ICD-10-CM | POA: Insufficient documentation

## 2018-07-03 DIAGNOSIS — Z7984 Long term (current) use of oral hypoglycemic drugs: Secondary | ICD-10-CM | POA: Insufficient documentation

## 2018-07-03 DIAGNOSIS — F17210 Nicotine dependence, cigarettes, uncomplicated: Secondary | ICD-10-CM | POA: Diagnosis not present

## 2018-07-03 DIAGNOSIS — Z79899 Other long term (current) drug therapy: Secondary | ICD-10-CM | POA: Insufficient documentation

## 2018-07-03 DIAGNOSIS — Z96652 Presence of left artificial knee joint: Secondary | ICD-10-CM | POA: Insufficient documentation

## 2018-07-03 DIAGNOSIS — R05 Cough: Secondary | ICD-10-CM

## 2018-07-03 DIAGNOSIS — E119 Type 2 diabetes mellitus without complications: Secondary | ICD-10-CM | POA: Diagnosis not present

## 2018-07-03 DIAGNOSIS — Z7982 Long term (current) use of aspirin: Secondary | ICD-10-CM | POA: Insufficient documentation

## 2018-07-03 DIAGNOSIS — I10 Essential (primary) hypertension: Secondary | ICD-10-CM | POA: Insufficient documentation

## 2018-07-03 MED ORDER — ALBUTEROL SULFATE HFA 108 (90 BASE) MCG/ACT IN AERS
2.0000 | INHALATION_SPRAY | Freq: Once | RESPIRATORY_TRACT | Status: AC
  Administered 2018-07-03: 2 via RESPIRATORY_TRACT
  Filled 2018-07-03: qty 6.7

## 2018-07-03 MED ORDER — AMOXICILLIN-POT CLAVULANATE 875-125 MG PO TABS: 1.0000 | ORAL_TABLET | Freq: Two times a day (BID) | ORAL | 0 refills | Status: DC

## 2018-07-03 NOTE — ED Provider Notes (Signed)
[facility] EMERGENCY DEPARTMENT Provider Note   CSN: [id number] Arrival date & time: 07/03/18  0441     History   Chief Complaint Chief Complaint  Patient presents with  . Cough  . Nasal Congestion  . Sore Throat    HPI PHOENYX PAULSEN is a 77 y.o. male.  The history is provided by the patient.  Cough  This is a new problem. The current episode started more than 1 week ago. The problem occurs every few minutes. The problem has been gradually worsening. There has been no fever. Associated symptoms include sore throat. Pertinent negatives include no chills and no shortness of breath. Associated symptoms comments: Mild chest tightness during coughing. Treatments tried: OTC meds. The treatment provided no relief.  Sore Throat  Pertinent negatives include no shortness of breath.  Patient with history of diabetes, hypertension presents with cough.  He reports for over 2 weeks he has been having cough, sore throat, congestion.  He reports it seemed to begin after getting an influenza vaccination.  He saw his PCP over 3 days ago, and received an unspecified shot for this infection.  He is not on home antibiotics. Tonight he reports his cough became worse and had some mild chest tightness.  No hemoptysis.  No active shortness of breath at this time.  He is currently smoking and is not interested in quitting Past Medical History:  Diagnosis Date  . Chest pain    NO significant CAD by cath 03/23/12  . Diabetes mellitus type II   . Dyslipidemia   . Essential hypertension   . Gastritis and duodenitis    EGD 03/2012  . Hiatal hernia   . Schatzki's ring   . Tobacco abuse     Patient Active Problem List   Diagnosis Date Noted  . Barrett esophagus 03/29/2012  . Gastroduodenitis 03/25/2012  . Schatzki's ring 03/25/2012  . Chest pain 03/16/2012  . HTN (hypertension) 03/16/2012  . Dyslipidemia 03/16/2012    Past Surgical History:  Procedure Laterality Date  . CHOLECYSTECTOMY    .  ESOPHAGOGASTRODUODENOSCOPY  03/25/2012   Procedure: ESOPHAGOGASTRODUODENOSCOPY (EGD);  Surgeon: [name], MD;  Location: [place];  Service: Gastroenterology;  Laterality: N/A;  . INGUINAL HERNIA REPAIR     bilateral  . JOINT REPLACEMENT     ltknee arthroscopic,rt shoulder arthroscopic  . KNEE SURGERY     left arthroscopy  . LEFT HEART CATHETERIZATION WITH CORONARY ANGIOGRAM N/A 03/23/2012   Procedure: LEFT HEART CATHETERIZATION WITH CORONARY ANGIOGRAM;  Surgeon: [name], MD;  Location: [facility] CATH LAB;  Service: Cardiovascular;  Laterality: N/A;  . SHOULDER SURGERY     right        Home Medications    Prior to Admission medications   Medication Sig Start Date End Date Taking? Authorizing Provider  aspirin EC 81 MG tablet Take 81 mg by mouth daily.    [provider]  enalapril (VASOTEC) 10 MG tablet Take 10 mg by mouth daily.    [provider]  metFORMIN (GLUCOPHAGE) 500 MG tablet Take 500 mg by mouth 3 (three) times daily. 02/15/15   [provider]  Multiple Vitamin (MULTIVITAMIN WITH MINERALS) TABS Take 1 tablet by mouth daily.    [provider]  nitroGLYCERIN (NITROSTAT) 0.4 MG SL tablet Place 1 tablet (0.4 mg total) under the tongue every 5 (five) minutes as needed for chest pain. (Not to exceed 3 in a 15 minute time frame.  If no relief after second  dose, call 911. 05/10/15   Herminio Commons, MD  simvastatin (ZOCOR) 40 MG tablet Take 40 mg by mouth every evening.    [provider]  triamcinolone cream (KENALOG) 0.1 % Apply 1 application topically daily as needed for rash. 06/17/16   [provider]    Family History Family History  Problem Relation Age of Onset  . Cancer Father        Lung  . Coronary artery disease Brother 22    Social History Social History   Tobacco Use  . Smoking status: Current Every Day Smoker    Packs/day: 1.00    Years: 60.00    Pack years: 60.00    Types: Cigarettes     Start date: 08/23/1959  . Smokeless tobacco: Never Used  . Tobacco comment: He has quit many times.  Substance Use Topics  . Alcohol use: Yes    Alcohol/week: 1.0 standard drinks    Types: 1 Shots of liquor per week    Comment: once in a while  . Drug use: No     Allergies   Morphine and related and Scopolamine hbr   Review of Systems Review of Systems  Constitutional: Negative for chills.  HENT: Positive for sore throat.   Respiratory: Positive for cough. Negative for shortness of breath.   Cardiovascular: Negative for leg swelling.  All other systems reviewed and are negative.    Physical Exam Updated Vital Signs BP (!) 162/91 (BP Location: Left Arm)   Pulse (!) 56   Temp 98 F (36.7 C) (Oral)   Resp 20   Ht 1.702 m (5\' 7" )   Wt 70.3 kg   SpO2 100%   BMI 24.28 kg/m   Physical Exam CONSTITUTIONAL: Well developed/well nourished HEAD: Normocephalic/atraumatic EYES: EOMI/PERRL ENMT: Mucous membranes moist, uvula midline without erythema or exudate NECK: supple no meningeal signs SPINE/BACK:entire spine nontender CV: S1/S2 noted, no murmurs/rubs/gallops noted LUNGS: Lungs are clear to auscultation bilaterally, no apparent distress ABDOMEN: soft, nontender, no rebound or guarding, bowel sounds noted throughout abdomen GU:no cva tenderness NEURO: Pt is awake/alert/appropriate, moves all extremitiesx4.  No facial droop.   EXTREMITIES: pulses normal/equal, full ROM SKIN: warm, color normal PSYCH: no abnormalities of mood noted, alert and oriented to situation   ED Treatments / Results  Labs (all labs ordered are listed, but only abnormal results are displayed) Labs Reviewed - No data to display  EKG EKG Interpretation  Date/Time:  Friday July 03 2018 05:29:24 EDT Ventricular Rate:  48 PR Interval:    QRS Duration: 110 QT Interval:  468 QTC Calculation: 419 R Axis:   45 Text Interpretation:  Sinus bradycardia Baseline wander in lead(s) V3 Confirmed by  Ripley Fraise [phone]) on 07/03/2018 5:37:42 AM   Radiology Dg Chest 2 View  Result Date: 07/03/2018 CLINICAL DATA:  Cough and chest pain for 3 days. EXAM: CHEST - 2 VIEW COMPARISON:  08/04/2017 FINDINGS: The cardiomediastinal contours are normal. The lungs are clear. Unchanged calcified granuloma in the right upper lung. Pulmonary vasculature is normal. No consolidation, pleural effusion, or pneumothorax. No acute osseous abnormalities are seen. IMPRESSION: No acute findings. Electronically Signed   By: Keith Rake M.D.   On: 07/03/2018 05:34    Procedures Procedures Medications Ordered in ED Medications  albuterol (PROVENTIL HFA;VENTOLIN HFA) 108 (90 Base) MCG/ACT inhaler 2 puff (2 puffs Inhalation Given 07/03/18 0547)     Initial Impression / Assessment and Plan / ED Course  I have reviewed the triage vital  signs and the nursing notes.  Pertinent imaging results that were available during my care of the patient were reviewed by me and considered in my medical decision making (see chart for details).     6:29 AM Patient presented with cough and sinus congestion for several weeks.  Chest x-ray was negative.  He did feel improved with albuterol MDI.  He was counseled on stopping smoking.  He is in no acute distress.  This appears to be infectious etiology.  EKG was performed due to vague complaint of chest pain, but suspect this is due to underlying illness.  Less likely ACS or PE. Due to having infectious symptoms progress in 2 weeks, his age, he is a smoker, will place him on antibiotics, Augmentin was sent to his pharmacy. Pt Agreed with plan, we discussed strict return precautions  Final Clinical Impressions(s) / ED Diagnoses   Final diagnoses:  Upper respiratory tract infection, unspecified type  Cough    ED Discharge Orders         Ordered    amoxicillin-clavulanate (AUGMENTIN) 875-125 MG tablet  2 times daily     07/03/18 9741           Ripley Fraise,  MD 07/03/18 0630

## 2018-07-03 NOTE — ED Triage Notes (Signed)
Pt reports 3 days of cough, congestion, sore throat, states he got "a shot" on Monday but states he is not feeling much better.

## 2018-07-15 ENCOUNTER — Encounter: Payer: Self-pay | Admitting: *Deleted

## 2018-07-15 ENCOUNTER — Encounter: Payer: Self-pay | Admitting: Nurse Practitioner

## 2018-07-15 ENCOUNTER — Other Ambulatory Visit: Payer: Self-pay | Admitting: *Deleted

## 2018-07-15 ENCOUNTER — Ambulatory Visit (INDEPENDENT_AMBULATORY_CARE_PROVIDER_SITE_OTHER): Payer: No Typology Code available for payment source | Admitting: Nurse Practitioner

## 2018-07-15 DIAGNOSIS — Z860100 Personal history of colon polyps, unspecified: Secondary | ICD-10-CM | POA: Insufficient documentation

## 2018-07-15 NOTE — Patient Instructions (Signed)
1. Continue your current medications. 2. We will schedule your colonoscopy for you. 3. Further recommendations will be made after your colonoscopy. 4. Return for follow-up based on the recommendations made after colonoscopy.   5. Call us if you have any questions or concerns.  At [facility] we value your feedback. You may receive a survey about your visit today. Please share your experience as we strive to create trusting relationships with our patients to provide genuine, compassionate, quality care.  We appreciate your understanding and patience as we review any laboratory studies, imaging, and other diagnostic tests that are ordered as we care for you. Our office policy is 5 business days for review of these results, and any emergent or urgent results are addressed in a timely manner for your best interest. If you do not hear from our office in 1 week, please contact us.   We also encourage the use of MyChart, which contains your medical information for your review as well. If you are not enrolled in this feature, an access code is on this after visit summary for your convenience. Thank you for allowing [place] to be involved in your care.  It was great to meet you today!  I hope you have a great Fall!!

## 2018-07-15 NOTE — Progress Notes (Signed)
Primary Care Physician:  Monico Blitz, MD Primary Gastroenterologist:  Dr. Oneida Alar  Chief Complaint  Patient presents with  . Colonoscopy    last tcs in [place]; unsure of year    HPI:   Richard Rocha is a 77 y.o. male who presents on referral from the [facility] system for colonoscopy.  Triage was deferred to office visit due to patient age (77 years old).  A previously scheduled appointment 05/28/2018 was canceled.  Reviewed information provided with the referral including office visit dated 02/20/2018.  At that time it was noted patient's wife passed away 2 months prior to complications and COPD after 11 years of marriage.  He is doing as well as can be expected.  Has companionship with his dog at home named "freckles" and he loves dearly.  Last colonoscopy dated 11/29/2011 (in [place], [place]) with 3 tubular adenomas and fair prep with a recommended repeat in 3 years.  He did not have a repeat colonoscopy for unknown reasons.  He has not been referred to [place] for a colonoscopy..  No history of colonoscopy could be found in our system.  Today he states he's doing ok overall. Lost his wife 7 months now and is doing his best to get on, still some sadness but pressing along. Has fecal frequency at times, on average 4-5 stools a day, typically soft and pass easy (consistent with [place] 4); occasionally softer; looser stools since cholecystectomy. Denies abdominal pain, N/V, fever, chills, hematochezia, melena. Does some dark stools when he takes pepto. Occasional GERD symptoms, takes Rolaids or Pepto. Denies chest pain, dyspnea, dizziness, lightheadedness, syncope, near syncope. Denies any other upper or lower GI symptoms.  Past Medical History:  Diagnosis Date  . Chest pain    NO significant CAD by cath 03/23/12  . Diabetes mellitus type II   . Dyslipidemia   . Essential hypertension   . Gastritis and duodenitis    EGD 03/2012  . Hiatal hernia   . Schatzki's ring   . Tobacco abuse      Past Surgical History:  Procedure Laterality Date  . CHOLECYSTECTOMY    . ESOPHAGOGASTRODUODENOSCOPY  03/25/2012   Procedure: ESOPHAGOGASTRODUODENOSCOPY (EGD);  Surgeon: [name], MD;  Location: [place];  Service: Gastroenterology;  Laterality: N/A;  . INGUINAL HERNIA REPAIR     bilateral  . JOINT REPLACEMENT     ltknee arthroscopic,rt shoulder arthroscopic  . KNEE SURGERY     left arthroscopy  . LEFT HEART CATHETERIZATION WITH CORONARY ANGIOGRAM N/A 03/23/2012   Procedure: LEFT HEART CATHETERIZATION WITH CORONARY ANGIOGRAM;  Surgeon: [name], MD;  Location[facility] C[facility] LAB;  Service: Cardiovascular;  Laterality: N/A;  . SHOULDER SURGERY     right    Current Outpatient Medications  Medication Sig Dispense Refill  . aspirin EC 81 MG tablet Take 81 mg by mouth daily.    . enalapril (VASOTEC) 10 MG tablet Take 10 mg by mouth daily.    . meclizine (ANTIVERT) 25 MG tablet Take 25 mg by mouth as needed for dizziness.    . metFORMIN (GLUCOPHAGE) 500 MG tablet Take 500 mg by mouth daily.   5  . Multiple Vitamin (MULTIVITAMIN WITH MINERALS) TABS Take 1 tablet by mouth daily.    . nitroGLYCERIN (NITROSTAT) 0.4 MG SL tablet Place 1 tablet (0.4 mg total) under the tongue every 5 (five) minutes as needed for chest pain. (Not to exceed 3 in a 15 minute time frame.  If no  relief after second dose, call 911. 25 tablet 3  . pantoprazole (PROTONIX) 40 MG tablet Take 40 mg by mouth daily.    . simvastatin (ZOCOR) 40 MG tablet Take 40 mg by mouth every evening.    . triamcinolone cream (KENALOG) 0.1 % Apply 1 application topically daily as needed for rash.     No current facility-administered medications for this visit.     Allergies as of 07/15/2018 - Review Complete 07/15/2018  Allergen Reaction Noted  . Morphine and related Other (See Comments) 03/24/2012  . Scopolamine hbr Other (See Comments) 03/24/2012    Family History  Problem Relation Age of Onset  . Cancer Father         Lung  . Coronary artery disease Brother 56    Social History   Socioeconomic History  . Marital status: Married    Spouse name: Not on file  . Number of children: 3  . Years of education: Not on file  . Highest education level: Not on file  Occupational History  . Occupation: Truck driving (part time)    Employer: [employer]  . Financial resource strain: Not on file  . Food insecurity:    Worry: Not on file    Inability: Not on file  . Transportation needs:    Medical: Not on file    Non-medical: Not on file  Tobacco Use  . Smoking status: Current Every Day Smoker    Packs/day: 1.00    Years: 60.00    Pack years: 60.00    Types: Cigarettes    Start date: 08/23/1959  . Smokeless tobacco: Never Used  . Tobacco comment: He has quit many times.  Substance and Sexual Activity  . Alcohol use: Yes    Alcohol/week: 1.0 standard drinks    Types: 1 Shots of liquor per week    Comment: once in a while  . Drug use: No  . Sexual activity: Yes    Birth control/protection: None  Lifestyle  . Physical activity:    Days per week: Not on file    Minutes per session: Not on file  . Stress: Not on file  Relationships  . Social connections:    Talks on phone: Not on file    Gets together: Not on file    Attends religious service: Not on file    Active member of club or organization: Not on file    Attends meetings of clubs or organizations: Not on file    Relationship status: Not on file  . Intimate partner violence:    Fear of current or ex partner: Not on file    Emotionally abused: Not on file    Physically abused: Not on file    Forced sexual activity: Not on file  Other Topics Concern  . Not on file  Social History Narrative   Lives with wife.      Review of Systems: Complete ROS negative except as per HPI.    Physical Exam: BP 137/84   Pulse (!) 53   Temp 98.3 F (36.8 C) (Oral)   Ht 5\' 7"  (1.702 m)   Wt 155 lb (70.3 kg)   BMI 24.28 kg/m    General:   Alert and oriented. Pleasant and cooperative. Well-nourished and well-developed.  Eyes:  Without icterus, sclera clear and conjunctiva pink.  Ears:  Normal auditory acuity. Cardiovascular:  S1, S2 present without murmurs appreciated. Normal pulses noted. Extremities without clubbing or edema. Respiratory:  Clear  to auscultation bilaterally. No wheezes, rales, or rhonchi. No distress.  Gastrointestinal:  +BS, soft, non-tender and non-distended. No HSM noted. No guarding or rebound. No masses appreciated.  Rectal:  Deferred  Musculoskalatal:  Symmetrical without gross deformities. Neurologic:  Alert and oriented x4;  grossly normal neurologically. Psych:  Alert and cooperative. Normal mood and affect. Heme/Lymph/Immune: No excessive bruising noted.    07/15/2018 3:14 PM   Disclaimer: This note was dictated with voice recognition software. Similar sounding words can inadvertently be transcribed and may not be corrected upon review.

## 2018-07-15 NOTE — Assessment & Plan Note (Signed)
Previous colonoscopy in 2013 with 3 colon polyps and fair prep.  Recommended 3-year repeat exam in 2016.  Patient did not have this exam.  Is currently overdue and he has been referred to [place] by the [facility] for colonoscopy.  He is generally asymptomatic from a GI standpoint.  He does have some softer/more frequent stools but this is been ongoing since his cholecystectomy and he feels it is not particularly bothersome.  Recommend he continue his current medications.  We will schedule colonoscopy for him.  Return for follow-up based on post procedure recommendations.  Proceed with colonoscopy with Dr. Oneida Alar in the near future. The risks, benefits, and alternatives have been discussed in detail with the patient. They state understanding and desire to proceed.   The patient is not on any anticoagulants, anxiolytics, chronic pain medications, or antidepressants.  Conscious sedation should be adequate for his procedure.

## 2018-07-16 NOTE — Progress Notes (Signed)
cc'd to pcp 

## 2018-08-13 ENCOUNTER — Telehealth: Payer: Self-pay

## 2018-08-13 NOTE — Telephone Encounter (Signed)
Called pt to reschedule TCS that was for 09/21/18 w/SLF d/t SLF will not be available that day. Pt agreeable for RMR to do TCS. Procedure rescheduled to 09/18/18 at 8:30am w/RMR. Endo scheduler aware. New instructions mailed.

## 2018-09-18 ENCOUNTER — Encounter (HOSPITAL_COMMUNITY)
Admission: RE | Disposition: A | Discharge status: Home / Self Care | Payer: Self-pay | Source: Home / Self Care | Attending: Internal Medicine

## 2018-09-18 ENCOUNTER — Encounter (HOSPITAL_COMMUNITY): Payer: Self-pay | Admitting: *Deleted

## 2018-09-18 ENCOUNTER — Other Ambulatory Visit: Payer: Self-pay

## 2018-09-18 ENCOUNTER — Ambulatory Visit (HOSPITAL_COMMUNITY)
Admission: RE | Admit: 2018-09-18 | Discharge: 2018-09-18 | Disposition: A | Discharge status: Home / Self Care | Payer: Medicare Other | Attending: Internal Medicine | Admitting: Internal Medicine

## 2018-09-18 DIAGNOSIS — K5730 Diverticulosis of large intestine without perforation or abscess without bleeding: Secondary | ICD-10-CM | POA: Insufficient documentation

## 2018-09-18 DIAGNOSIS — D125 Benign neoplasm of sigmoid colon: Secondary | ICD-10-CM | POA: Diagnosis not present

## 2018-09-18 DIAGNOSIS — Z801 Family history of malignant neoplasm of trachea, bronchus and lung: Secondary | ICD-10-CM | POA: Diagnosis not present

## 2018-09-18 DIAGNOSIS — Z9049 Acquired absence of other specified parts of digestive tract: Secondary | ICD-10-CM | POA: Diagnosis not present

## 2018-09-18 DIAGNOSIS — Z96652 Presence of left artificial knee joint: Secondary | ICD-10-CM | POA: Insufficient documentation

## 2018-09-18 DIAGNOSIS — I10 Essential (primary) hypertension: Secondary | ICD-10-CM | POA: Insufficient documentation

## 2018-09-18 DIAGNOSIS — Z86010 Personal history of colonic polyps: Secondary | ICD-10-CM | POA: Diagnosis not present

## 2018-09-18 DIAGNOSIS — D123 Benign neoplasm of transverse colon: Secondary | ICD-10-CM | POA: Diagnosis not present

## 2018-09-18 DIAGNOSIS — Z7982 Long term (current) use of aspirin: Secondary | ICD-10-CM | POA: Diagnosis not present

## 2018-09-18 DIAGNOSIS — Z1211 Encounter for screening for malignant neoplasm of colon: Secondary | ICD-10-CM | POA: Insufficient documentation

## 2018-09-18 DIAGNOSIS — D120 Benign neoplasm of cecum: Secondary | ICD-10-CM | POA: Diagnosis not present

## 2018-09-18 DIAGNOSIS — E785 Hyperlipidemia, unspecified: Secondary | ICD-10-CM | POA: Diagnosis not present

## 2018-09-18 DIAGNOSIS — Z96611 Presence of right artificial shoulder joint: Secondary | ICD-10-CM | POA: Insufficient documentation

## 2018-09-18 DIAGNOSIS — Z79899 Other long term (current) drug therapy: Secondary | ICD-10-CM | POA: Diagnosis not present

## 2018-09-18 DIAGNOSIS — E119 Type 2 diabetes mellitus without complications: Secondary | ICD-10-CM | POA: Diagnosis not present

## 2018-09-18 DIAGNOSIS — Z8249 Family history of ischemic heart disease and other diseases of the circulatory system: Secondary | ICD-10-CM | POA: Insufficient documentation

## 2018-09-18 DIAGNOSIS — K449 Diaphragmatic hernia without obstruction or gangrene: Secondary | ICD-10-CM | POA: Diagnosis not present

## 2018-09-18 DIAGNOSIS — F17210 Nicotine dependence, cigarettes, uncomplicated: Secondary | ICD-10-CM | POA: Insufficient documentation

## 2018-09-18 DIAGNOSIS — Z7984 Long term (current) use of oral hypoglycemic drugs: Secondary | ICD-10-CM | POA: Diagnosis not present

## 2018-09-18 DIAGNOSIS — Z885 Allergy status to narcotic agent status: Secondary | ICD-10-CM | POA: Diagnosis not present

## 2018-09-18 HISTORY — PX: POLYPECTOMY: SHX5525

## 2018-09-18 HISTORY — PX: COLONOSCOPY: SHX5424

## 2018-09-18 LAB — GLUCOSE, CAPILLARY: Glucose-Capillary: 118 mg/dL — ABNORMAL HIGH (ref 70–99)

## 2018-09-18 SURGERY — COLONOSCOPY
Anesthesia: Moderate Sedation

## 2018-09-18 MED ORDER — MIDAZOLAM HCL 5 MG/5ML IJ SOLN
INTRAMUSCULAR | Status: DC | PRN
  Administered 2018-09-18: 1 mg via INTRAVENOUS
  Administered 2018-09-18: 2 mg via INTRAVENOUS

## 2018-09-18 MED ORDER — MIDAZOLAM HCL 5 MG/5ML IJ SOLN
INTRAMUSCULAR | Status: AC
  Filled 2018-09-18: qty 10

## 2018-09-18 MED ORDER — ONDANSETRON HCL 4 MG/2ML IJ SOLN
INTRAMUSCULAR | Status: DC | PRN
  Administered 2018-09-18: 4 mg via INTRAVENOUS

## 2018-09-18 MED ORDER — MEPERIDINE HCL 50 MG/ML IJ SOLN
INTRAMUSCULAR | Status: AC
  Filled 2018-09-18: qty 1

## 2018-09-18 MED ORDER — SODIUM CHLORIDE 0.9 % IV SOLN
INTRAVENOUS | Status: DC
  Administered 2018-09-18: 1000 mL via INTRAVENOUS

## 2018-09-18 MED ORDER — MEPERIDINE HCL 100 MG/ML IJ SOLN
INTRAMUSCULAR | Status: DC | PRN
  Administered 2018-09-18: 15 mg via INTRAVENOUS
  Administered 2018-09-18: 25 mg via INTRAVENOUS

## 2018-09-18 MED ORDER — STERILE WATER FOR IRRIGATION IR SOLN
Status: DC | PRN
  Administered 2018-09-18: 1.5 mL

## 2018-09-18 MED ORDER — ONDANSETRON HCL 4 MG/2ML IJ SOLN
INTRAMUSCULAR | Status: AC
  Filled 2018-09-18: qty 2

## 2018-09-18 NOTE — H&P (Signed)
@LOGO @   Primary Care Physician:  Monico Blitz, MD Primary Gastroenterologist:  Dr. Gala Romney  Pre-Procedure History & Physical: HPI:  Richard Rocha is a 78 y.o. male here for overdue surveillance colonoscopy.  History of multiple colonic polyps removed a number of years ago in [place].  Referred for 1 more surveillance examination.  Past Medical History:  Diagnosis Date  . Chest pain    NO significant CAD by cath 03/23/12  . Diabetes mellitus type II   . Dyslipidemia   . Essential hypertension   . Gastritis and duodenitis    EGD 03/2012  . Hiatal hernia   . Schatzki's ring   . Tobacco abuse     Past Surgical History:  Procedure Laterality Date  . CARDIAC CATHETERIZATION    . CHOLECYSTECTOMY    . COLONOSCOPY    . ESOPHAGOGASTRODUODENOSCOPY  03/25/2012   Procedure: ESOPHAGOGASTRODUODENOSCOPY (EGD);  Surgeon: [name], MD;  Location: [place];  Service: Gastroenterology;  Laterality: N/A;  . INGUINAL HERNIA REPAIR     bilateral  . JOINT REPLACEMENT     ltknee arthroscopic,rt shoulder arthroscopic  . KNEE SURGERY     left arthroscopy  . LEFT HEART CATHETERIZATION WITH CORONARY ANGIOGRAM N/A 03/23/2012   Procedure: LEFT HEART CATHETERIZATION WITH CORONARY ANGIOGRAM;  Surgeon: [name], MD;  Location: [facility] CATH LAB;  Service: Cardiovascular;  Laterality: N/A;  . SHOULDER SURGERY     right    Prior to Admission medications   Medication Sig Start Date End Date Taking? Authorizing Provider  acetaminophen (TYLENOL) 650 MG CR tablet Take 650-1,300 mg by mouth every 8 (eight) hours as needed for pain.   Yes [provider]  aspirin EC 81 MG tablet Take 81 mg by mouth daily.   Yes [provider]  calcium carbonate (TUMS - DOSED IN MG ELEMENTAL CALCIUM) 500 MG chewable tablet Chew 1-2 tablets by mouth 3 (three) times daily as needed for indigestion or heartburn.   Yes [provider]  enalapril (VASOTEC) 10 MG tablet Take 10 mg by mouth daily.    Yes [provider]  metFORMIN (GLUCOPHAGE) 500 MG tablet Take 500 mg by mouth daily.  02/15/15  Yes [provider]  Multiple Vitamin (MULTIVITAMIN WITH MINERALS) TABS Take 1 tablet by mouth daily. Centrum   Yes [provider]  simvastatin (ZOCOR) 40 MG tablet Take 40 mg by mouth every evening.   Yes [provider]  triamcinolone cream (KENALOG) 0.1 % Apply 1 application topically 2 (two) times daily as needed (skin irritation/rash).  06/17/16  Yes [provider]  meclizine (ANTIVERT) 25 MG tablet Take 25 mg by mouth 2 (two) times daily as needed for dizziness.     [provider]  nitroGLYCERIN (NITROSTAT) 0.4 MG SL tablet Place 1 tablet (0.4 mg total) under the tongue every 5 (five) minutes as needed for chest pain. (Not to exceed 3 in a 15 minute time frame.  If no relief after second dose, call 911. 05/10/15   Herminio Commons, MD    Allergies as of 07/15/2018 - Review Complete 07/15/2018  Allergen Reaction Noted  . Morphine and related Other (See Comments) 03/24/2012  . Scopolamine hbr Other (See Comments) 03/24/2012    Family History  Problem Relation Age of Onset  . Cancer Father        Lung  . Coronary artery disease Brother 4    Social History   Socioeconomic History  . Marital status: Widowed  Spouse name: Not on file  . Number of children: 3  . Years of education: Not on file  . Highest education level: Not on file  Occupational History  . Occupation: Truck driving (part time)    Employer: [employer]  . Financial resource strain: Not on file  . Food insecurity:    Worry: Not on file    Inability: Not on file  . Transportation needs:    Medical: Not on file    Non-medical: Not on file  Tobacco Use  . Smoking status: Current Every Day Smoker    Packs/day: 1.00    Years: 60.00    Pack years: 60.00    Types: Cigarettes    Start date: 08/23/1959  . Smokeless tobacco: Never Used  .  Tobacco comment: He has quit many times.  Substance and Sexual Activity  . Alcohol use: Yes    Alcohol/week: 1.0 standard drinks    Types: 1 Shots of liquor per week    Comment: once in a while  . Drug use: No  . Sexual activity: Yes    Birth control/protection: None  Lifestyle  . Physical activity:    Days per week: Not on file    Minutes per session: Not on file  . Stress: Not on file  Relationships  . Social connections:    Talks on phone: Not on file    Gets together: Not on file    Attends religious service: Not on file    Active member of club or organization: Not on file    Attends meetings of clubs or organizations: Not on file    Relationship status: Not on file  . Intimate partner violence:    Fear of current or ex partner: Not on file    Emotionally abused: Not on file    Physically abused: Not on file    Forced sexual activity: Not on file  Other Topics Concern  . Not on file  Social History Narrative   Lives with wife.      Review of Systems: See HPI, otherwise negative ROS  Physical Exam: BP (!) 149/67   Pulse (!) 56   Temp 97.7 F (36.5 C) (Oral)   Resp 19   Ht 5\' 7"  (1.702 m)   Wt 76.2 kg   SpO2 100%   BMI 26.31 kg/m  General:   Alert,  Well-developed, well-nourished, pleasant and cooperative in NAD Neck:  Supple; no masses or thyromegaly. No significant cervical adenopathy. Lungs:  Clear throughout to auscultation.   No wheezes, crackles, or rhonchi. No acute distress. Heart:  Regular rate and rhythm; no murmurs, clicks, rubs,  or gallops. Abdomen: Non-distended, normal bowel sounds.  Soft and nontender without appreciable mass or hepatosplenomegaly.  Pulses:  Normal pulses noted. Extremities:  Without clubbing or edema.  Impression/Plan: 78 year old gentleman with a history of colonic adenoma somewhat in the distant past.  Overdue for surveillance examination.  I have offered the patient a surveillance colonoscopy today. The risks, benefits,  limitations, alternatives and imponderables have been reviewed with the patient. Questions have been answered. All parties are agreeable.     Notice: This dictation was prepared with Dragon dictation along with smaller phrase technology. Any transcriptional errors that result from this process are unintentional and may not be corrected upon review.

## 2018-09-18 NOTE — Op Note (Signed)
[facility] Patient Name: Richard Rocha Procedure Date: 09/18/2018 8:19 AM MRN: [id number] Date of Birth: 1941/08/31 Attending MD: Norvel Richards , MD CSN: [id number] Age: 78 Admit Type: Outpatient Procedure:                Colonoscopy Indications:              High risk colon cancer surveillance: Personal                            history of colonic polyps Providers:                Norvel Richards, MD, Charlsie Quest. Theda Sers RN, RN,                            Nelma Rothman, Technician Referring MD:              Medicines:                Meperidine mg IV Complications:            No immediate complications. Estimated Blood Loss:     Estimated blood loss was minimal. Procedure:                Pre-Anesthesia Assessment:                           - Prior to the procedure, a History and Physical                            was performed, and patient medications and                            allergies were reviewed. The patient's tolerance of                            previous anesthesia was also reviewed. The risks                            and benefits of the procedure and the sedation                            options and risks were discussed with the patient.                            All questions were answered, and informed consent                            was obtained. Prior Anticoagulants: The patient has                            taken no previous anticoagulant or antiplatelet                            agents. ASA Grade Assessment: II - A patient with  mild systemic disease. After reviewing the risks                            and benefits, the patient was deemed in                            satisfactory condition to undergo the procedure.                           After obtaining informed consent, the colonoscope                            was passed under direct vision. Throughout the                            procedure, the patient's  blood pressure, pulse, and                            oxygen saturations were monitored continuously. The                            CF-HQ190L ([id number]) scope was introduced through                            the anus and advanced to the the cecum, identified                            by appendiceal orifice and ileocecal valve. The                            colonoscopy was performed without difficulty. The                            patient tolerated the procedure well. The quality                            of the bowel preparation was adequate. The                            ileocecal valve, appendiceal orifice, and rectum                            were photographed. The colonoscopy was performed                            without difficulty. The patient tolerated the                            procedure well. The quality of the bowel                            preparation was excellent. Scope In: 8:33:49 AM Scope Out: 8:47:15 AM Scope Withdrawal Time: 0 hours 8 minutes 20 seconds  Total Procedure Duration: 0 hours 13 minutes  26 seconds  Findings:      The perianal and digital rectal examinations were normal.      Multiple medium-mouthed diverticula were found in the sigmoid colon and       descending colon.      The exam was otherwise without abnormality on direct and retroflexion       views.      Five sessile polyps were found in the sigmoid colon, hepatic flexure and       cecum. The polyps were 5 to 7 mm in size. These polyps were removed with       a cold snare. Resection and retrieval were complete. Estimated blood       loss was minimal.      The exam was otherwise without abnormality on direct and retroflexion       views. Impression:               - Diverticulosis in the sigmoid colon and in the                            descending colon.                           - The examination was otherwise normal on direct                            and retroflexion views.                            - Five 5 to 7 mm polyps in the sigmoid colon, at                            the hepatic flexure and in the cecum, removed with                            a cold snare. Resected and retrieved.                           - The examination was otherwise normal on direct                            and retroflexion views. Moderate Sedation:      Moderate (conscious) sedation was administered by the endoscopy nurse       and supervised by the endoscopist. The following parameters were       monitored: oxygen saturation, heart rate, blood pressure, respiratory       rate, EKG, adequacy of pulmonary ventilation, and response to care.       Total physician intraservice time was 20 minutes. Recommendation:           - Written discharge instructions were provided to                            the patient.                           - The signs and symptoms of potential delayed  complications were discussed with the patient.                           - Patient has a contact number available for                            emergencies.                           - Return to normal activities tomorrow.                           - Advance diet as tolerated.                           - Repeat colonoscopy date to be determined after                            pending pathology results are reviewed for                            surveillance based on pathology results.                           - Return to GI office (date not yet determined). Procedure Code(s):        --- Professional ---                           [phone], Colonoscopy, flexible; with removal of                            tumor(s), polyp(s), or other lesion(s) by snare                            technique                           G0500, Moderate sedation services provided by the                            same physician or other qualified health care                            professional  performing a gastrointestinal                            endoscopic service that sedation supports,                            requiring the presence of an independent trained                            observer to assist in the monitoring of the                            patient's level  of consciousness and physiological                            status; initial 15 minutes of intra-service time;                            patient age 56 years or older (additional time may                            be reported with [phone], as appropriate) Diagnosis Code(s):        --- Professional ---                           Z86.010, Personal history of colonic polyps                           D12.5, Benign neoplasm of sigmoid colon                           D12.3, Benign neoplasm of transverse colon (hepatic                            flexure or splenic flexure)                           D12.0, Benign neoplasm of cecum                           K57.30, Diverticulosis of large intestine without                            perforation or abscess without bleeding CPT copyright 2018 [employer] Association. All rights reserved. The codes documented in this report are preliminary and upon coder review may  be revised to meet current compliance requirements. Richard Rocha. Richard Leis, MD Norvel Richards, MD 09/18/2018 8:56:14 AM This report has been signed electronically. Number of Addenda: 0

## 2018-09-18 NOTE — Discharge Instructions (Signed)
Colonoscopy Discharge Instructions  Read the instructions outlined below and refer to this sheet in the next few weeks. These discharge instructions provide you with general information on caring for yourself after you leave the hospital. Your doctor may also give you specific instructions. While your treatment has been planned according to the most current medical practices available, unavoidable complications occasionally occur. If you have any problems or questions after discharge, call Dr. Gala Romney at [phone]. ACTIVITY  You may resume your regular activity, but move at a slower pace for the next 24 hours.   Take frequent rest periods for the next 24 hours.   Walking will help get rid of the air and reduce the bloated feeling in your belly (abdomen).   No driving for 24 hours (because of the medicine (anesthesia) used during the test).    Do not sign any important legal documents or operate any machinery for 24 hours (because of the anesthesia used during the test).  NUTRITION  Drink plenty of fluids.   You may resume your normal diet as instructed by your doctor.   Begin with a light meal and progress to your normal diet. Heavy or fried foods are harder to digest and may make you feel sick to your stomach (nauseated).   Avoid alcoholic beverages for 24 hours or as instructed.  MEDICATIONS  You may resume your normal medications unless your doctor tells you otherwise.  WHAT YOU CAN EXPECT TODAY  Some feelings of bloating in the abdomen.   Passage of more gas than usual.   Spotting of blood in your stool or on the toilet paper.  IF YOU HAD POLYPS REMOVED DURING THE COLONOSCOPY:  No aspirin products for 7 days or as instructed.   No alcohol for 7 days or as instructed.   Eat a soft diet for the next 24 hours.  FINDING OUT THE RESULTS OF YOUR TEST Not all test results are available during your visit. If your test results are not back during the visit, make an appointment  with your caregiver to find out the results. Do not assume everything is normal if you have not heard from your caregiver or the medical facility. It is important for you to follow up on all of your test results.  SEEK IMMEDIATE MEDICAL ATTENTION IF:  You have more than a spotting of blood in your stool.   Your belly is swollen (abdominal distention).   You are nauseated or vomiting.   You have a temperature over 101.   You have abdominal pain or discomfort that is severe or gets worse throughout the day.    Diverticulosis and colon polyp information provided  Further recommendations to follow pending review of pathology report  Diverticulosis  Diverticulosis is a condition that develops when small pouches (diverticula) form in the wall of the large intestine (colon). The colon is where water is absorbed and stool is formed. The pouches form when the inside layer of the colon pushes through weak spots in the outer layers of the colon. You may have a few pouches or many of them. What are the causes? The cause of this condition is not known. What increases the risk? The following factors may make you more likely to develop this condition:  Being older than age 57. Your risk for this condition increases with age. Diverticulosis is rare among people younger than age 55. By age 60, many people have it.  Eating a low-fiber diet.  Having frequent constipation.  Being overweight.  Not getting enough exercise.  Smoking.  Taking over-the-counter pain medicines, like aspirin and ibuprofen.  Having a family history of diverticulosis. What are the signs or symptoms? In most people, there are no symptoms of this condition. If you do have symptoms, they may include:  Bloating.  Cramps in the abdomen.  Constipation or diarrhea.  Pain in the lower left side of the abdomen. How is this diagnosed? This condition is most often diagnosed during an exam for other colon problems. Because  diverticulosis usually has no symptoms, it often cannot be diagnosed independently. This condition may be diagnosed by:  Using a flexible scope to examine the colon (colonoscopy).  Taking an X-ray of the colon after dye has been put into the colon (barium enema).  Doing a CT scan. How is this treated? You may not need treatment for this condition if you have never developed an infection related to diverticulosis. If you have had an infection before, treatment may include:  Eating a high-fiber diet. This may include eating more fruits, vegetables, and grains.  Taking a fiber supplement.  Taking a live bacteria supplement (probiotic).  Taking medicine to relax your colon.  Taking antibiotic medicines. Follow these instructions at home:  Drink 6-8 glasses of water or more each day to prevent constipation.  Try not to strain when you have a bowel movement.  If you have had an infection before: ? Eat more fiber as directed by your health care provider or your diet and nutrition specialist (dietitian). ? Take a fiber supplement or probiotic, if your health care provider approves.  Take over-the-counter and prescription medicines only as told by your health care provider.  If you were prescribed an antibiotic, take it as told by your health care provider. Do not stop taking the antibiotic even if you start to feel better.  Keep all follow-up visits as told by your health care provider. This is important. Contact a health care provider if:  You have pain in your abdomen.  You have bloating.  You have cramps.  You have not had a bowel movement in 3 days. Get help right away if:  Your pain gets worse.  Your bloating becomes very bad.  You have a fever or chills, and your symptoms suddenly get worse.  You vomit.  You have bowel movements that are bloody or black.  You have bleeding from your rectum. Summary  Diverticulosis is a condition that develops when small pouches  (diverticula) form in the wall of the large intestine (colon).  You may have a few pouches or many of them.  This condition is most often diagnosed during an exam for other colon problems.  If you have had an infection related to diverticulosis, treatment may include increasing the fiber in your diet, taking supplements, or taking medicines. This information is not intended to replace advice given to you by your health care provider. Make sure you discuss any questions you have with your health care provider. Document Released: 05/23/2004 Document Revised: 07/15/2016 Document Reviewed: 07/15/2016 Elsevier Interactive Patient Education  2019 [employer].

## 2018-09-22 ENCOUNTER — Encounter: Payer: Self-pay | Admitting: Internal Medicine

## 2018-09-22 ENCOUNTER — Encounter (HOSPITAL_COMMUNITY): Payer: Self-pay | Admitting: Internal Medicine

## 2019-06-18 ENCOUNTER — Other Ambulatory Visit: Payer: Self-pay | Admitting: *Deleted

## 2019-06-18 DIAGNOSIS — Z20822 Contact with and (suspected) exposure to covid-19: Secondary | ICD-10-CM

## 2019-06-20 LAB — NOVEL CORONAVIRUS, NAA: SARS-CoV-2, NAA: NOT DETECTED

## 2019-09-20 DIAGNOSIS — R0789 Other chest pain: Secondary | ICD-10-CM | POA: Diagnosis not present

## 2019-09-20 DIAGNOSIS — J449 Chronic obstructive pulmonary disease, unspecified: Secondary | ICD-10-CM | POA: Diagnosis not present

## 2019-09-20 DIAGNOSIS — I10 Essential (primary) hypertension: Secondary | ICD-10-CM | POA: Diagnosis not present

## 2019-09-20 DIAGNOSIS — Z299 Encounter for prophylactic measures, unspecified: Secondary | ICD-10-CM | POA: Diagnosis not present

## 2019-09-20 DIAGNOSIS — F17210 Nicotine dependence, cigarettes, uncomplicated: Secondary | ICD-10-CM | POA: Diagnosis not present

## 2019-09-20 DIAGNOSIS — R079 Chest pain, unspecified: Secondary | ICD-10-CM | POA: Diagnosis not present

## 2019-09-20 DIAGNOSIS — E1165 Type 2 diabetes mellitus with hyperglycemia: Secondary | ICD-10-CM | POA: Diagnosis not present

## 2019-10-08 DIAGNOSIS — R079 Chest pain, unspecified: Secondary | ICD-10-CM | POA: Diagnosis not present

## 2019-10-10 ENCOUNTER — Ambulatory Visit: Payer: Non-veteran care

## 2019-10-11 DIAGNOSIS — F17210 Nicotine dependence, cigarettes, uncomplicated: Secondary | ICD-10-CM | POA: Diagnosis not present

## 2019-10-11 DIAGNOSIS — E1165 Type 2 diabetes mellitus with hyperglycemia: Secondary | ICD-10-CM | POA: Diagnosis not present

## 2019-10-11 DIAGNOSIS — M4800 Spinal stenosis, site unspecified: Secondary | ICD-10-CM | POA: Diagnosis not present

## 2019-10-11 DIAGNOSIS — J449 Chronic obstructive pulmonary disease, unspecified: Secondary | ICD-10-CM | POA: Diagnosis not present

## 2019-10-11 DIAGNOSIS — Z299 Encounter for prophylactic measures, unspecified: Secondary | ICD-10-CM | POA: Diagnosis not present

## 2019-10-11 DIAGNOSIS — I10 Essential (primary) hypertension: Secondary | ICD-10-CM | POA: Diagnosis not present

## 2019-10-17 ENCOUNTER — Ambulatory Visit: Payer: Medicare Other

## 2019-10-18 DIAGNOSIS — Z299 Encounter for prophylactic measures, unspecified: Secondary | ICD-10-CM | POA: Diagnosis not present

## 2019-10-18 DIAGNOSIS — E1165 Type 2 diabetes mellitus with hyperglycemia: Secondary | ICD-10-CM | POA: Diagnosis not present

## 2019-10-18 DIAGNOSIS — R0789 Other chest pain: Secondary | ICD-10-CM | POA: Diagnosis not present

## 2019-10-18 DIAGNOSIS — F17210 Nicotine dependence, cigarettes, uncomplicated: Secondary | ICD-10-CM | POA: Diagnosis not present

## 2019-10-18 DIAGNOSIS — K219 Gastro-esophageal reflux disease without esophagitis: Secondary | ICD-10-CM | POA: Diagnosis not present

## 2019-10-18 DIAGNOSIS — I10 Essential (primary) hypertension: Secondary | ICD-10-CM | POA: Diagnosis not present

## 2019-10-21 ENCOUNTER — Ambulatory Visit: Payer: Non-veteran care

## 2019-11-10 DIAGNOSIS — J449 Chronic obstructive pulmonary disease, unspecified: Secondary | ICD-10-CM | POA: Diagnosis not present

## 2019-11-10 DIAGNOSIS — I10 Essential (primary) hypertension: Secondary | ICD-10-CM | POA: Diagnosis not present

## 2019-11-10 DIAGNOSIS — E119 Type 2 diabetes mellitus without complications: Secondary | ICD-10-CM | POA: Diagnosis not present

## 2019-11-10 DIAGNOSIS — E7800 Pure hypercholesterolemia, unspecified: Secondary | ICD-10-CM | POA: Diagnosis not present

## 2019-11-26 DIAGNOSIS — Z1211 Encounter for screening for malignant neoplasm of colon: Secondary | ICD-10-CM | POA: Diagnosis not present

## 2019-11-26 DIAGNOSIS — Z299 Encounter for prophylactic measures, unspecified: Secondary | ICD-10-CM | POA: Diagnosis not present

## 2019-11-26 DIAGNOSIS — F17210 Nicotine dependence, cigarettes, uncomplicated: Secondary | ICD-10-CM | POA: Diagnosis not present

## 2019-11-26 DIAGNOSIS — Z0000 Encounter for general adult medical examination without abnormal findings: Secondary | ICD-10-CM | POA: Diagnosis not present

## 2019-11-26 DIAGNOSIS — E7800 Pure hypercholesterolemia, unspecified: Secondary | ICD-10-CM | POA: Diagnosis not present

## 2019-11-26 DIAGNOSIS — Z79899 Other long term (current) drug therapy: Secondary | ICD-10-CM | POA: Diagnosis not present

## 2019-11-26 DIAGNOSIS — Z7189 Other specified counseling: Secondary | ICD-10-CM | POA: Diagnosis not present

## 2019-11-26 DIAGNOSIS — E1165 Type 2 diabetes mellitus with hyperglycemia: Secondary | ICD-10-CM | POA: Diagnosis not present

## 2019-11-26 DIAGNOSIS — R5383 Other fatigue: Secondary | ICD-10-CM | POA: Diagnosis not present

## 2019-12-07 DIAGNOSIS — M79605 Pain in left leg: Secondary | ICD-10-CM | POA: Diagnosis not present

## 2019-12-07 DIAGNOSIS — E1140 Type 2 diabetes mellitus with diabetic neuropathy, unspecified: Secondary | ICD-10-CM | POA: Diagnosis not present

## 2019-12-07 DIAGNOSIS — M79604 Pain in right leg: Secondary | ICD-10-CM | POA: Diagnosis not present

## 2019-12-21 DIAGNOSIS — I10 Essential (primary) hypertension: Secondary | ICD-10-CM | POA: Diagnosis not present

## 2019-12-21 DIAGNOSIS — E1165 Type 2 diabetes mellitus with hyperglycemia: Secondary | ICD-10-CM | POA: Diagnosis not present

## 2019-12-21 DIAGNOSIS — Z299 Encounter for prophylactic measures, unspecified: Secondary | ICD-10-CM | POA: Diagnosis not present

## 2019-12-21 DIAGNOSIS — J449 Chronic obstructive pulmonary disease, unspecified: Secondary | ICD-10-CM | POA: Diagnosis not present

## 2019-12-22 DIAGNOSIS — J449 Chronic obstructive pulmonary disease, unspecified: Secondary | ICD-10-CM | POA: Diagnosis not present

## 2019-12-22 DIAGNOSIS — E7800 Pure hypercholesterolemia, unspecified: Secondary | ICD-10-CM | POA: Diagnosis not present

## 2019-12-22 DIAGNOSIS — E119 Type 2 diabetes mellitus without complications: Secondary | ICD-10-CM | POA: Diagnosis not present

## 2019-12-22 DIAGNOSIS — I10 Essential (primary) hypertension: Secondary | ICD-10-CM | POA: Diagnosis not present

## 2019-12-24 DIAGNOSIS — J449 Chronic obstructive pulmonary disease, unspecified: Secondary | ICD-10-CM | POA: Diagnosis not present

## 2019-12-24 DIAGNOSIS — I10 Essential (primary) hypertension: Secondary | ICD-10-CM | POA: Diagnosis not present

## 2019-12-24 DIAGNOSIS — Z299 Encounter for prophylactic measures, unspecified: Secondary | ICD-10-CM | POA: Diagnosis not present

## 2019-12-24 DIAGNOSIS — F17210 Nicotine dependence, cigarettes, uncomplicated: Secondary | ICD-10-CM | POA: Diagnosis not present

## 2020-01-07 DIAGNOSIS — E1165 Type 2 diabetes mellitus with hyperglycemia: Secondary | ICD-10-CM | POA: Diagnosis not present

## 2020-01-07 DIAGNOSIS — Z299 Encounter for prophylactic measures, unspecified: Secondary | ICD-10-CM | POA: Diagnosis not present

## 2020-01-07 DIAGNOSIS — I10 Essential (primary) hypertension: Secondary | ICD-10-CM | POA: Diagnosis not present

## 2020-01-07 DIAGNOSIS — F17210 Nicotine dependence, cigarettes, uncomplicated: Secondary | ICD-10-CM | POA: Diagnosis not present

## 2020-02-08 DIAGNOSIS — Z299 Encounter for prophylactic measures, unspecified: Secondary | ICD-10-CM | POA: Diagnosis not present

## 2020-02-08 DIAGNOSIS — R0789 Other chest pain: Secondary | ICD-10-CM | POA: Diagnosis not present

## 2020-02-08 DIAGNOSIS — F17210 Nicotine dependence, cigarettes, uncomplicated: Secondary | ICD-10-CM | POA: Diagnosis not present

## 2020-02-08 DIAGNOSIS — R079 Chest pain, unspecified: Secondary | ICD-10-CM | POA: Diagnosis not present

## 2020-02-08 DIAGNOSIS — K219 Gastro-esophageal reflux disease without esophagitis: Secondary | ICD-10-CM | POA: Diagnosis not present

## 2020-02-22 ENCOUNTER — Encounter: Payer: Self-pay | Admitting: Cardiovascular Disease

## 2020-02-22 ENCOUNTER — Ambulatory Visit (INDEPENDENT_AMBULATORY_CARE_PROVIDER_SITE_OTHER): Payer: Medicare HMO | Admitting: Cardiovascular Disease

## 2020-02-22 ENCOUNTER — Other Ambulatory Visit: Payer: Self-pay

## 2020-02-22 DIAGNOSIS — R079 Chest pain, unspecified: Secondary | ICD-10-CM

## 2020-02-22 MED ORDER — METOPROLOL TARTRATE 25 MG PO TABS: 25.0000 mg | ORAL_TABLET | ORAL | 0 refills | Status: DC

## 2020-02-22 NOTE — Progress Notes (Signed)
CARDIOLOGY CONSULT NOTE       Patient ID: Richard Rocha MRN: [id number] DOB/AGE: 06-06-1941 79 y.o.  Admit date: (Not on file) Referring Physician: Manuella Ghazi Primary Physician: Monico Blitz, MD Primary Cardiologist: New Reason for Consultation: Chest Pain  Active Problems:   * No active hospital problems. *   HPI:  79 y.o. referred by DR Manuella Ghazi for chest pain CRF include DM, HLD and HTN.  Moderate SSCP recent onset He has had a normal cardiac cath in 2013 Seen by Dr Bronson Ing in 2017 for chest pain Seen at [facility] ED at that time r/o. Note indicates lots of "stress"  No w/u done Pain not thought to be cardiac  He had atypical pain Saturday night and Sunday am Took 2 nitro seemed to help Doesn't get pain during day Episodes only last minutes and not associated with diaphoresis , dyspnea, palpitations. He indicates having a recent stress test in [place] that was ok He does not think pain is pleuritic or positional   He is widowed 2 years Married 60 years. 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 Chest pain    NO significant CAD by cath 03/23/12  . Diabetes mellitus type II   . Dyslipidemia   . Essential hypertension   . Gastritis and duodenitis    EGD 03/2012  . Hiatal hernia   . Schatzki's ring   . Tobacco abuse     Family History  Problem Relation Age of Onset  . Cancer Father        Lung  . Coronary artery disease Brother 30    Social History   Socioeconomic History  . Marital status: Widowed    Spouse name: Not on file  . Number of children: 3  . Years of education: Not on file  . Highest education level: Not on file  Occupational History  . Occupation: Truck driving (part time)    Employer: [employer]  Tobacco Use  . Smoking status:  Current Every Day Smoker    Packs/day: 1.00    Years: 60.00    Pack years: 60.00    Types: Cigarettes    Start date: 08/23/1959  . Smokeless tobacco: Never Used  . Tobacco comment: He has quit many times.  Vaping Use  . Vaping Use: Never used  Substance and Sexual Activity  . Alcohol use: Yes    Alcohol/week: 1.0 standard drink    Types: 1 Shots of liquor per week    Comment: once in a while  . Drug use: No  . Sexual activity: Yes    Birth control/protection: None  Other Topics Concern  . Not on file  Social History Narrative   Lives with wife.     Social Determinants of Health   Financial Resource Strain:   . Difficulty of Paying Living Expenses:   Food Insecurity:   . Worried About Charity fundraiser in the Last Year:   . Arboriculturist in the Last Year:   Transportation Needs:   . Film/video editor (Medical):   Marland Kitchen Lack of Transportation (Non-Medical):   Physical Activity:   . Days of Exercise per Week:   . Minutes of Exercise per Session:  Stress:   . Feeling of Stress :   Social Connections:   . Frequency of Communication with Friends and Family:   . Frequency of Social Gatherings with Friends and Family:   . Attends Religious Services:   . Active Member of Clubs or Organizations:   . Attends Archivist Meetings:   Marland Kitchen Marital Status:   Intimate Partner Violence:   . Fear of Current or Ex-Partner:   . Emotionally Abused:   Marland Kitchen Physically Abused:   . Sexually Abused:     Past Surgical History:  Procedure Laterality Date  . CARDIAC CATHETERIZATION    . CHOLECYSTECTOMY    . COLONOSCOPY    . COLONOSCOPY N/A 09/18/2018   Procedure: COLONOSCOPY;  Surgeon: [name], MD;  [facility];  Service: Endoscopy;  Laterality: N/A;  8:30am  . ESOPHAGOGASTRODUODENOSCOPY  03/25/2012   Procedure: ESOPHAGOGASTRODUODENOSCOPY (EGD);  Surgeon: [name], MD;  Location: [facility];  Service: Gastroenterology;  Laterality: N/A;  . INGUINAL  HERNIA REPAIR     bilateral  . JOINT REPLACEMENT     ltknee arthroscopic,rt shoulder arthroscopic  . KNEE SURGERY     left arthroscopy  . LEFT HEART CATHETERIZATION WITH CORONARY ANGIOGRAM N/A 03/23/2012   Procedure: LEFT HEART CATHETERIZATION WITH CORONARY ANGIOGRAM;  Surgeon: [name], MD;  Location: [facility] CATH LAB;  Service: Cardiovascular;  Laterality: N/A;  . POLYPECTOMY  09/18/2018   Procedure: POLYPECTOMY;  Surgeon: [name], MD;  Location[facility];  Service: Endoscopy;;  hepatic flexure (CSx2), cecal (CSx1), splenic flexure(CSx2)  . SHOULDER SURGERY     right      Current Outpatient Medications:  .  acetaminophen (TYLENOL) 650 MG CR tablet, Take 650-1,300 mg by mouth every 8 (eight) hours as needed for pain., Disp: , Rfl:  .  amLODipine (NORVASC) 5 MG tablet, Take 5 mg by mouth daily., Disp: , Rfl:  .  aspirin EC 81 MG tablet, Take 81 mg by mouth daily., Disp: , Rfl:  .  calcium carbonate (TUMS - DOSED IN MG ELEMENTAL CALCIUM) 500 MG chewable tablet, Chew 1-2 tablets by mouth 3 (three) times daily as needed for indigestion or heartburn., Disp: , Rfl:  .  enalapril (VASOTEC) 10 MG tablet, Take 20 mg by mouth in the morning and at bedtime. , Disp: , Rfl:  .  meclizine (ANTIVERT) 25 MG tablet, Take 25 mg by mouth 2 (two) times daily as needed for dizziness. , Disp: , Rfl:  .  metFORMIN (GLUCOPHAGE) 500 MG tablet, Take 500 mg by mouth daily. , Disp: , Rfl: 5 .  Multiple Vitamin (MULTIVITAMIN WITH MINERALS) TABS, Take 1 tablet by mouth daily. Centrum, Disp: , Rfl:  .  nitroGLYCERIN (NITROSTAT) 0.4 MG SL tablet, Place 1 tablet (0.4 mg total) under the tongue every 5 (five) minutes as needed for chest pain. (Not to exceed 3 in a 15 minute time frame.  If no relief after second dose, call 911., Disp: 25 tablet, Rfl: 3 .  pantoprazole (PROTONIX) 40 MG tablet, Take 40 mg by mouth in the morning and at bedtime., Disp: , Rfl:  .  simvastatin (ZOCOR) 40 MG tablet, Take 40 mg by  mouth every evening., Disp: , Rfl:  .  triamcinolone cream (KENALOG) 0.1 %, Apply 1 application topically 2 (two) times daily as needed (skin irritation/rash). , Disp: , Rfl:     Physical Exam: Blood pressure 138/84, pulse 62, height 5\' 7"  (1.702 m), weight 165 lb 9.6  oz (75.1 kg).    Affect appropriate Healthy:  appears stated age 53: normal Neck supple with no adenopathy JVP normal no bruits no thyromegaly Lungs clear with no wheezing and good diaphragmatic motion Heart:  S1/S2 no murmur, no rub, gallop or click PMI normal Abdomen: benighn, BS positve, no tenderness, no AAA no bruit.  No HSM or HJR post right inguinal hernia repair  Distal pulses intact with no bruits No edema Neuro non-focal Skin warm and dry No muscular weakness   Labs:   Lab Results  Component Value Date   WBC 11.0 (H) 03/24/2012   HGB 14.5 03/24/2012   HCT 43.2 03/24/2012   MCV 89.6 03/24/2012   PLT 167 03/24/2012   No results for input(s): NA, K, CL, CO2, BUN, CREATININE, CALCIUM, PROT, BILITOT, ALKPHOS, ALT, AST, GLUCOSE in the last 168 hours.  Invalid input(s): LABALBU No results found for: CKTOTAL, CKMB, CKMBINDEX, TROPONINI  Lab Results  Component Value Date   CHOL 148 03/24/2012   Lab Results  Component Value Date   HDL 31 (L) 03/24/2012   Lab Results  Component Value Date   LDLCALC 70 03/24/2012   Lab Results  Component Value Date   TRIG 237 (H) 03/24/2012   Lab Results  Component Value Date   CHOLHDL 4.8 03/24/2012   No results found for: LDLDIRECT    Radiology: No results found.  EKG: SR rate 55 normal 02/08/20    ASSESSMENT AND PLAN:   1. HTN:  Well controlled.  Continue current medications and low sodium Dash type diet.   2. HLD:  Continue statin target LDL 70 or less  3. DM:  Discussed low carb diet.  Target hemoglobin A1c is 6.5 or less.  Continue current medications. 4. Chest Pain :  recurrent with multiple risk factors and ongoing smoking will do anatomic  test This time with cardiac CTA. He is thin with low HR and should be a good candidate for it  HR in 60's will give only 25 mg lopressor oral 2 hours before study. No contrast allergy   Signed: Jenkins Rouge 02/22/2020, 9:49 AM

## 2020-02-22 NOTE — Patient Instructions (Addendum)
Medication Instructions:  Your physician recommends that you continue on your current medications as directed. Please refer to the Current Medication list given to you today.  Take Lopressor 25 mg two Prior to CT Scan  *If you need a refill on your cardiac medications before your next appointment, please call your pharmacy*   Lab Work: Your physician recommends that you return for lab work the week before your CT scan.   If you have labs (blood work) drawn today and your tests are completely normal, you will receive your results only by: Marland Kitchen MyChart Message (if you have MyChart) OR . A paper copy in the mail If you have any lab test that is abnormal or we need to change your treatment, we will call you to review the results.   Testing/Procedures: Cardiac CT   Follow-Up: At [facility], you and your health needs are our priority.  As part of our continuing mission to provide you with exceptional heart care, we have created designated Provider Care Teams.  These Care Teams include your primary Cardiologist (physician) and Advanced Practice Providers (APPs -  Physician Assistants and Nurse Practitioners) who all work together to provide you with the care you need, when you need it.  We recommend signing up for the patient portal called "MyChart".  Sign up information is provided on this After Visit Summary.  MyChart is used to connect with patients for Virtual Visits (Telemedicine).  Patients are able to view lab/test results, encounter notes, upcoming appointments, etc.  Non-urgent messages can be sent to your provider as well.   To learn more about what you can do with MyChart, go to [email or web address].    Your next appointment:    Pending Test Results   The format for your next appointment:   In Person  Provider:   Jenkins Rouge, MD   Other Instructions Thank you for choosing [facility]!  Your cardiac CT will be scheduled at one of the below locations:   [facility] [address] [place], [place] [address] [phone]   If scheduled at [facility], please arrive at the [facility] main entrance of [facility] 30 minutes prior to test start time. Proceed to the [facility] Radiology Department (first floor) to check-in and test prep.   Please follow these instructions carefully (unless otherwise directed):  Hold all erectile dysfunction medications at least 3 days (72 hrs) prior to test.  On the Night Before the Test: . Be sure to Drink plenty of water. . Do not consume any caffeinated/decaffeinated beverages or chocolate 12 hours prior to your test. . Do not take any antihistamines 12 hours prior to your test.   On the Day of the Test: . Drink plenty of water. Do not drink any water within one hour of the test. . Do not eat any food 4 hours prior to the test. . You may take your regular medications prior to the test.  . Take metoprolol (Lopressor) two hours prior to test. . HOLD Furosemide/Hydrochlorothiazide morning of the test.  *For Clinical Staff only. Please instruct patient the following:*        -Drink plenty of water       -Hold Furosemide/hydrochlorothiazide morning of the test       -Take metoprolol (Lopressor) 2 hours prior to test (if applicable).                  -If HR is less than 55 BPM-  No Lopressor                -IF HR is greater than 55 BPM and patient is less than or equal to 46 yrs old Lopressor 134m x1.                -If HR is greater than 55 BPM and patient is greater than 760yrs old Lopressor 50 mg x1.     Do not give Lopressor to patients with an allergy to lopressor or anyone with asthma or active COPD symptoms (currently taking steroids).       After the Test: . Drink plenty of water. . After receiving IV contrast, you may experience a mild flushed feeling. This is normal. . On occasion, you may experience a mild rash up to 24 hours after the test. This is not  dangerous. If this occurs, you can take Benadryl 25 mg and increase your fluid intake. . If you experience trouble breathing, this can be serious. If it is severe call 911 IMMEDIATELY. If it is mild, please call our office. . If you take any of these medications: Glipizide/Metformin, Avandament, Glucavance, please do not take 48 hours after completing test unless otherwise instructed.   Once we have confirmed authorization from your insurance company, we will call you to set up a date and time for your test.   For non-scheduling related questions, please contact the cardiac imaging nurse navigator should you have any questions/concerns: SMarchia Bond Cardiac Imaging Nurse Navigator MBurley Saver Interim Cardiac Imaging Nurse [facility] Direct Office Dial: [phone]  For scheduling needs, including cancellations and rescheduling, please call [phone]

## 2020-02-29 DIAGNOSIS — Z299 Encounter for prophylactic measures, unspecified: Secondary | ICD-10-CM | POA: Diagnosis not present

## 2020-02-29 DIAGNOSIS — I10 Essential (primary) hypertension: Secondary | ICD-10-CM | POA: Diagnosis not present

## 2020-02-29 DIAGNOSIS — M6530 Trigger finger, unspecified finger: Secondary | ICD-10-CM | POA: Diagnosis not present

## 2020-02-29 DIAGNOSIS — E1165 Type 2 diabetes mellitus with hyperglycemia: Secondary | ICD-10-CM | POA: Diagnosis not present

## 2020-02-29 DIAGNOSIS — J449 Chronic obstructive pulmonary disease, unspecified: Secondary | ICD-10-CM | POA: Diagnosis not present

## 2020-02-29 DIAGNOSIS — F17210 Nicotine dependence, cigarettes, uncomplicated: Secondary | ICD-10-CM | POA: Diagnosis not present

## 2020-03-06 ENCOUNTER — Ambulatory Visit: Payer: Non-veteran care | Admitting: Cardiovascular Disease

## 2020-03-08 DIAGNOSIS — I10 Essential (primary) hypertension: Secondary | ICD-10-CM | POA: Diagnosis not present

## 2020-03-08 DIAGNOSIS — E119 Type 2 diabetes mellitus without complications: Secondary | ICD-10-CM | POA: Diagnosis not present

## 2020-03-08 DIAGNOSIS — J449 Chronic obstructive pulmonary disease, unspecified: Secondary | ICD-10-CM | POA: Diagnosis not present

## 2020-03-08 DIAGNOSIS — E7800 Pure hypercholesterolemia, unspecified: Secondary | ICD-10-CM | POA: Diagnosis not present

## 2020-03-09 ENCOUNTER — Other Ambulatory Visit: Payer: Self-pay

## 2020-03-09 ENCOUNTER — Other Ambulatory Visit (HOSPITAL_COMMUNITY)
Admission: RE | Admit: 2020-03-09 | Discharge: 2020-03-09 | Disposition: A | Discharge status: Home / Self Care | Payer: Medicare HMO | Source: Ambulatory Visit | Attending: Cardiovascular Disease | Admitting: Cardiovascular Disease

## 2020-03-09 DIAGNOSIS — R079 Chest pain, unspecified: Secondary | ICD-10-CM | POA: Diagnosis not present

## 2020-03-09 LAB — BASIC METABOLIC PANEL
Anion gap: 10 (ref 5–15)
BUN: 15 mg/dL (ref 8–23)
CO2: 26 mmol/L (ref 22–32)
Calcium: 9.2 mg/dL (ref 8.9–10.3)
Chloride: 102 mmol/L (ref 98–111)
Creatinine, Ser: 0.83 mg/dL (ref 0.61–1.24)
GFR calc Af Amer: >60 mL/min (ref >60)
GFR calc non Af Amer: >60 mL/min (ref >60)
Glucose, Bld: 124 mg/dL — ABNORMAL HIGH (ref 70–99)
Potassium: 4.3 mmol/L (ref 3.5–5.1)
Sodium: 138 mmol/L (ref 135–145)

## 2020-03-15 ENCOUNTER — Telehealth (HOSPITAL_COMMUNITY): Payer: Self-pay | Admitting: *Deleted

## 2020-03-15 NOTE — Telephone Encounter (Signed)

## 2020-03-17 ENCOUNTER — Ambulatory Visit (HOSPITAL_COMMUNITY)
Admission: RE | Admit: 2020-03-17 | Discharge: 2020-03-17 | Disposition: A | Discharge status: Home / Self Care | Payer: Medicare HMO | Source: Ambulatory Visit | Attending: Cardiovascular Disease | Admitting: Cardiovascular Disease

## 2020-03-17 ENCOUNTER — Other Ambulatory Visit: Payer: Self-pay

## 2020-03-17 DIAGNOSIS — R079 Chest pain, unspecified: Secondary | ICD-10-CM | POA: Diagnosis not present

## 2020-03-17 DIAGNOSIS — I2510 Atherosclerotic heart disease of native coronary artery without angina pectoris: Secondary | ICD-10-CM | POA: Diagnosis not present

## 2020-03-17 MED ORDER — NITROGLYCERIN 0.4 MG SL SUBL
SUBLINGUAL_TABLET | SUBLINGUAL | Status: AC
  Filled 2020-03-17: qty 2

## 2020-03-17 MED ORDER — NITROGLYCERIN 0.4 MG SL SUBL: 0.8000 mg | SUBLINGUAL_TABLET | Freq: Once | SUBLINGUAL | Status: DC

## 2020-03-17 MED ORDER — IOHEXOL 350 MG/ML SOLN
80.0000 mL | Freq: Once | INTRAVENOUS | Status: AC | PRN
  Administered 2020-03-17: 80 mL via INTRAVENOUS

## 2020-03-17 NOTE — Progress Notes (Signed)
CT scan completed. Tolerated well. D/C home ambulatory, awake and alert. In no distress. 

## 2020-03-18 DIAGNOSIS — I2510 Atherosclerotic heart disease of native coronary artery without angina pectoris: Secondary | ICD-10-CM | POA: Diagnosis not present

## 2020-03-24 ENCOUNTER — Other Ambulatory Visit: Payer: Self-pay

## 2020-03-24 ENCOUNTER — Encounter: Payer: Self-pay | Admitting: Student

## 2020-03-24 ENCOUNTER — Ambulatory Visit (INDEPENDENT_AMBULATORY_CARE_PROVIDER_SITE_OTHER): Payer: Medicare HMO | Admitting: Student

## 2020-03-24 VITALS — BP 142/68 | HR 57 | Ht 67.0 in | Wt 165.6 lb

## 2020-03-24 DIAGNOSIS — I25118 Atherosclerotic heart disease of native coronary artery with other forms of angina pectoris: Secondary | ICD-10-CM

## 2020-03-24 DIAGNOSIS — I10 Essential (primary) hypertension: Secondary | ICD-10-CM | POA: Diagnosis not present

## 2020-03-24 DIAGNOSIS — E785 Hyperlipidemia, unspecified: Secondary | ICD-10-CM

## 2020-03-24 DIAGNOSIS — Z01810 Encounter for preprocedural cardiovascular examination: Secondary | ICD-10-CM

## 2020-03-24 DIAGNOSIS — R079 Chest pain, unspecified: Secondary | ICD-10-CM

## 2020-03-24 DIAGNOSIS — Z01818 Encounter for other preprocedural examination: Secondary | ICD-10-CM

## 2020-03-24 DIAGNOSIS — Z720 Tobacco use: Secondary | ICD-10-CM

## 2020-03-24 MED ORDER — AMLODIPINE BESYLATE 5 MG PO TABS: 5.0000 mg | ORAL_TABLET | Freq: Every day | ORAL | Status: AC

## 2020-03-24 MED ORDER — SIMVASTATIN 40 MG PO TABS: 20.0000 mg | ORAL_TABLET | Freq: Every evening | ORAL | 11 refills | Status: DC

## 2020-03-24 MED ORDER — AMLODIPINE BESYLATE 5 MG PO TABS: 5.0000 mg | ORAL_TABLET | Freq: Two times a day (BID) | ORAL | Status: DC

## 2020-03-24 NOTE — H&P (View-Only) (Signed)
Cardiology Office Note    Date:  03/25/2020   ID:  Zay, Yeargan [birth year], MRN [id number]  PCP:  Monico Blitz, MD  Cardiologist: Jenkins Rouge, MD    Chief Complaint  Patient presents with  . Follow-up    s/p Coronary CT    History of Present Illness:    Richard Rocha is a 79 y.o. male with past medical history of HTN, HLD, Type 2 DM and tobacco use who presents to the office today for follow-up from his recent Coronary CT.  He was examined by Dr. Johnsie Cancel in 02/2020 as a new patient referral for chest pain. He had previously had a normal heart catheterization in 2013 but reported recurrent episodes of chest pain several days prior to his visit which would typically occur at night but was relieved with sublingual nitroglycerin. Given his symptoms and multiple cardiac risk factors, a Coronary CT was recommended for further evaluation. The radiology over read showed no significant incidental noncardiac findings but he did have possible obstructive disease in the LAD which was sent for FFR. FFR was positive for stenosis along the distal LAD and Dr. Johnsie Cancel recommended a cardiac catheterization for definitive evaluation.  In talking with the patient today, he reports still having intermittent episodes of chest discomfort which typically occur at night and awake him from sleep. Symptoms last for several minutes and then resolve.  He reports having fatigue but denies any specific dyspnea on exertion. No recent orthopnea, PND, lower extremity edema or palpitations.  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 Smokes approximately 1 ppd.    Past Medical History:  Diagnosis Date  . Chest pain    NO significant CAD by cath 03/23/12  . Diabetes mellitus type II   . Dyslipidemia   . Essential hypertension   . Gastritis and duodenitis    EGD 03/2012  . Hiatal hernia   . Schatzki's ring   . Tobacco abuse     Past Surgical History:  Procedure  Laterality Date  . CARDIAC CATHETERIZATION    . CHOLECYSTECTOMY    . COLONOSCOPY    . COLONOSCOPY N/A 09/18/2018   Procedure: COLONOSCOPY;  Surgeon: [name], MD;  Location: [facility];  Service: Endoscopy;  Laterality: N/A;  8:30am  . ESOPHAGOGASTRODUODENOSCOPY  03/25/2012   Procedure: ESOPHAGOGASTRODUODENOSCOPY (EGD);  Surgeon: [name], MD;  Location: [facility];  Service: Gastroenterology;  Laterality: N/A;  . INGUINAL HERNIA REPAIR     bilateral  . JOINT REPLACEMENT     ltknee arthroscopic,rt shoulder arthroscopic  . KNEE SURGERY     left arthroscopy  . LEFT HEART CATHETERIZATION WITH CORONARY ANGIOGRAM N/A 03/23/2012   Procedure: LEFT HEART CATHETERIZATION WITH CORONARY ANGIOGRAM;  Surgeon: [name], MD;  Location: [facility] CATH LAB;  Service: Cardiovascular;  Laterality: N/A;  . POLYPECTOMY  09/18/2018   Procedure: POLYPECTOMY;  Surgeon: [name], MD;  Location: [facility];  Service: Endoscopy;;  hepatic flexure (CSx2), cecal (CSx1), splenic flexure(CSx2)  . SHOULDER SURGERY     right    Current Medications: Outpatient Medications Prior to Visit  Medication Sig Dispense Refill  . acetaminophen (TYLENOL) 650 MG CR tablet Take 650-1,300 mg by mouth every 8 (eight) hours as needed for pain.    Marland Kitchen aspirin EC 81 MG tablet Take 81 mg by mouth daily.    . calcium carbonate (TUMS - DOSED IN MG ELEMENTAL CALCIUM)  500 MG chewable tablet Chew 1-2 tablets by mouth 3 (three) times daily as needed for indigestion or heartburn.    . enalapril (VASOTEC) 10 MG tablet Take 20 mg by mouth in the morning and at bedtime.     . meclizine (ANTIVERT) 25 MG tablet Take 25 mg by mouth 2 (two) times daily as needed for dizziness.     . metFORMIN (GLUCOPHAGE) 500 MG tablet Take 500 mg by mouth daily.   5  . Multiple Vitamin (MULTIVITAMIN WITH MINERALS) TABS Take 1 tablet by mouth daily. Centrum    . nitroGLYCERIN (NITROSTAT) 0.4 MG SL tablet Place 1 tablet (0.4 mg total) under the  tongue every 5 (five) minutes as needed for chest pain. (Not to exceed 3 in a 15 minute time frame.  If no relief after second dose, call 911. 25 tablet 3  . pantoprazole (PROTONIX) 40 MG tablet Take 40 mg by mouth in the morning and at bedtime.    . triamcinolone cream (KENALOG) 0.1 % Apply 1 application topically 2 (two) times daily as needed (skin irritation/rash).     Marland Kitchen amLODipine (NORVASC) 5 MG tablet Take 5 mg by mouth daily.    . simvastatin (ZOCOR) 40 MG tablet Take 40 mg by mouth every evening.    . metoprolol tartrate (LOPRESSOR) 25 MG tablet Take 1 tablet (25 mg total) by mouth as directed. Two hours prior to CT Scan (Patient not taking: Reported on 03/24/2020) 1 tablet 0   No facility-administered medications prior to visit.     Allergies:   Morphine, Morphine and related, Scopolamine, and Scopolamine hbr   Social History   Socioeconomic History  . Marital status: Widowed    Spouse name: Not on file  . Number of children: 3  . Years of education: Not on file  . Highest education level: Not on file  Occupational History  . Occupation: Truck driving (part time)    Employer: [employer]  Tobacco Use  . Smoking status: Current Every Day Smoker    Packs/day: 1.00    Years: 60.00    Pack years: 60.00    Types: Cigarettes    Start date: 08/23/1959  . Smokeless tobacco: Never Used  . Tobacco comment: He has quit many times.  Vaping Use  . Vaping Use: Never used  Substance and Sexual Activity  . Alcohol use: Yes    Alcohol/week: 1.0 standard drink    Types: 1 Shots of liquor per week    Comment: once in a while  . Drug use: No  . Sexual activity: Yes    Birth control/protection: None  Other Topics Concern  . Not on file  Social History Narrative   Lives with wife.     Social Determinants of Health   Financial Resource Strain:   . Difficulty of Paying Living Expenses:   Food Insecurity:   . Worried About Charity fundraiser in the Last Year:   . Arboriculturist  in the Last Year:   Transportation Needs:   . Film/video editor (Medical):   Marland Kitchen Lack of Transportation (Non-Medical):   Physical Activity:   . Days of Exercise per Week:   . Minutes of Exercise per Session:   Stress:   . Feeling of Stress :   Social Connections:   . Frequency of Communication with Friends and Family:   . Frequency of Social Gatherings with Friends and Family:   . Attends Religious Services:   . Active Member  of Clubs or Organizations:   . Attends Archivist Meetings:   Marland Kitchen Marital Status:      Family History:  The patient's family history includes Cancer in his father; Coronary artery disease (age of onset: 23) in his brother.   Review of Systems:   Please see the history of present illness.     General:  No chills, fever, night sweats or weight changes.  Cardiovascular:  No dyspnea on exertion, edema, orthopnea, palpitations, paroxysmal nocturnal dyspnea. Positive for chest pain.  Dermatological: No rash, lesions/masses Respiratory: No cough, dyspnea Urologic: No hematuria, dysuria Abdominal:   No nausea, vomiting, diarrhea, bright red blood per rectum, melena, or hematemesis Neurologic:  No visual changes, wkns, changes in mental status. All other systems reviewed and are otherwise negative except as noted above.   Physical Exam:    VS:  BP (!) 142/68   Pulse (!) 57   Ht 5\' 7"  (1.702 m)   Wt 165 lb 9.6 oz (75.1 kg)   SpO2 97%   BMI 25.94 kg/m    General: Well developed, well nourished,male appearing in no acute distress. Appears younger than stated age.  Head: Normocephalic, atraumatic, sclera non-icteric.  Neck: No carotid bruits. JVD not elevated.  Lungs: Respirations regular and unlabored, without wheezes or rales.  Heart: Regular rate and rhythm. No S3 or S4.  No murmur, no rubs, or gallops appreciated. Abdomen: Soft, non-tender, non-distended. No obvious abdominal masses. Msk:  Strength and tone appear normal for age. No obvious  joint deformities or effusions. Extremities: No clubbing or cyanosis. No lower extremity edema.  Distal pedal pulses are 2+ bilaterally. Neuro: Alert and oriented X 3. Moves all extremities spontaneously. No focal deficits noted. Psych:  Responds to questions appropriately with a normal affect. Skin: No rashes or lesions noted  Wt Readings from Last 3 Encounters:  03/24/20 165 lb 9.6 oz (75.1 kg)  02/22/20 165 lb 9.6 oz (75.1 kg)  09/18/18 168 lb (76.2 kg)      Studies/Labs Reviewed:   EKG:  EKG is ordered today.  The ekg ordered today demonstrates sinus bradycardia, HR 57 with no acute ST abnormalities.   Recent Labs: 03/09/2020: BUN 15; Creatinine, Ser 0.83; Potassium 4.3; Sodium 138   Lipid Panel    Component Value Date/Time   CHOL 148 03/24/2012 0515   TRIG 237 (H) 03/24/2012 0515   HDL 31 (L) 03/24/2012 0515   CHOLHDL 4.8 03/24/2012 0515   VLDL 47 (H) 03/24/2012 0515   LDLCALC 70 03/24/2012 0515    Additional studies/ records that were reviewed today include:   Cardiac Telemetry: 2016  Sinus rhythm with sinus arrhythmia.  No symptoms reported.   Coronary CT: 03/2020 FINDINGS: Non-cardiac: See separate report from [facility]. No significant findings on limited lung and soft tissue windows.  Calcium score: Calcium noted in LAD and Circumflex  Coronary Arteries: Left  dominant with no anomalies  LM: Normal  LAD: 50-69% % ostial, proximal and mid calcific plaque  D1: Small vessel  D2: Normal  Circumflex: 1-24% calcified plaque in proximal and mid vessel  OM1: Normal  OM2: Normal  PDA: Normal  PLB: Normal  RCA: Small non dominant  IMPRESSION: 1. Calcium score 330 which is 30 th percentile for age and sex  2.  Normal aortic root diameter 3.7 cm  3.  CAD RADS 3 CAD possibly obstructive in LAD Study sent for FFR CT  FINDINGS: Normal FFR CT in RCA and circumflex  FFR CT O.94  in proximal LAD  FFR CT 0.87 in mid  LAD  FFR CT 0.79 in distal LAD and 0.71 in apical LAD  IMPRESSION: FFR CT positive in distal LAD only   Assessment:    1. Coronary artery disease involving native coronary artery of native heart with other form of angina pectoris (HCC)   2. Chest pain, unspecified type   3. Pre-op testing   4. Essential hypertension   5. Hyperlipidemia LDL goal <70   6. Tobacco use      Plan:   In order of problems listed above:  1. Chest Pain/ Abnormal Coronary CT - His episodes of chest pain are somewhat atypical as they occur at rest and are not associated with exertion but they do awake him from sleep. Recent Coronary CT showed possible obstructive disease in the LAD and FFR was positive for stenosis along the distal LAD and Dr. Johnsie Cancel recommended a cardiac catheterization for definitive evaluation. - Results reviewed with the patient today and he agrees to proceed. The patient understands that risks include but are not limited to stroke (1 in 1000), death (1 in 41), kidney failure [usually temporary] (1 in 500), bleeding (1 in 200), allergic reaction [possibly serious] (1 in 200). Creatinine was stable at 0.83 by recent labs on 03/09/2020. Will obtain a CBC. Catheterization scheduled for next week with Dr. Irish Lack. - Continue ASA and statin therapy (with dose adjustment as outlined below).   2. HTN - BP is slightly elevated at 142/68 during today's visit but has been well-controlled at others. Will continue Amlodipine 5mg  daily and Enalapril 20mg  BID for now. If BP remains above goal, would further titrate Amlodipine.   3. HLD - No recent labs available for recent and a copy has been requested from his PCP. Goal LDL is less than 70 with documented coronary calcifications. He is currently on Simvastatin 40mg  daily but given the concurrent use of Amlodipine, the maximum recommended dose of this is 20mg  daily. I recommended he reduce his Simvastatin to 20mg  daily. If LDL above goal by repeat  labs, would plan to switch to Crestor.   4. Tobacco Use - He continues to smoke approximately 1 ppd. Cessation advised.   Medication Adjustments/Labs and Tests Ordered: Current medicines are reviewed at length with the patient today.  Concerns regarding medicines are outlined above.  Medication changes, Labs and Tests ordered today are listed in the Patient Instructions below. Patient Instructions  Medication Instructions:  DECREASE SIMVASTATIN TO 20 MG DAILY AT DINNER  *If you need a refill on your cardiac medications before your next appointment, please call your pharmacy*   Lab Work: CBC ON Monday 7/19  If you have labs (blood work) drawn today and your tests are completely normal, you will receive your results only by: Marland Kitchen MyChart Message (if you have MyChart) OR . A paper copy in the mail If you have any lab test that is abnormal or we need to change your treatment, we will call you to review the results.   Testing/Procedures: Your physician has requested that you have a cardiac catheterization. Cardiac catheterization is used to diagnose and/or treat various heart conditions. Doctors may recommend this procedure for a number of different reasons. The most common reason is to evaluate chest pain. Chest pain can be a symptom of coronary artery disease (CAD), and cardiac catheterization can show whether plaque is narrowing or blocking your heart's arteries. This procedure is also used to evaluate the valves, as well as measure the  blood flow and oxygen levels in different parts of your heart. For further information please visit [email or web address]. Please follow instruction sheet, as given.     Follow-Up: At [facility], you and your health needs are our priority.  As part of our continuing mission to provide you with exceptional heart care, we have created designated Provider Care Teams.  These Care Teams include your primary Cardiologist (physician) and Advanced Practice  Providers (APPs -  Physician Assistants and Nurse Practitioners) who all work together to provide you with the care you need, when you need it.  We recommend signing up for the patient portal called "MyChart".  Sign up information is provided on this After Visit Summary.  MyChart is used to connect with patients for Virtual Visits (Telemedicine).  Patients are able to view lab/test results, encounter notes, upcoming appointments, etc.  Non-urgent messages can be sent to your provider as well.   To learn more about what you can do with MyChart, go to [email or web address].    Your next appointment:   3-4  week(s)  The format for your next appointment:   In Person  Provider:   Bernerd Pho, PA-C   Other Instructions NONE     Signed, Erma Heritage, PA-C  03/25/2020 8:55 AM    [facility] [address] [place], [place] [address] Phone: [phone] Fax: [phone]

## 2020-03-24 NOTE — Progress Notes (Signed)
Cardiology Office Note    Date:  03/25/2020   ID:  Richard Rocha, Richard Rocha 1941/03/05, MRN [id number]  PCP:  Monico [facility], MD  Cardiologist: Jenkins Rouge, MD    Chief Complaint  Patient presents with  . Follow-up    s/p Coronary CT    History of Present Illness:    Richard Rocha is a 79 y.o. male with past medical history of HTN, HLD, Type 2 DM and tobacco use who presents to the office today for follow-up from his recent Coronary CT.  He was examined by Dr. Johnsie Cancel in 02/2020 as a new patient referral for chest pain. He had previously had a normal heart catheterization in 2013 but reported recurrent episodes of chest pain several days prior to his visit which would typically occur at night but was relieved with sublingual nitroglycerin. Given his symptoms and multiple cardiac risk factors, a Coronary CT was recommended for further evaluation. The radiology over read showed no significant incidental noncardiac findings but he did have possible obstructive disease in the LAD which was sent for FFR. FFR was positive for stenosis along the distal LAD and Dr. Johnsie Cancel recommended a cardiac catheterization for definitive evaluation.  In talking with the patient today, he reports still having intermittent episodes of chest discomfort which typically occur at night and awake him from sleep. Symptoms last for several minutes and then resolve.  He reports having fatigue but denies any specific dyspnea on exertion. No recent orthopnea, PND, lower extremity edema or palpitations.  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 Smokes approximately 1 ppd.    Past Medical History:  Diagnosis Date  . Chest pain    NO significant CAD by cath 03/23/12  . Diabetes mellitus type II   . Dyslipidemia   . Essential hypertension   . Gastritis and duodenitis    EGD 03/2012  . Hiatal hernia   . Schatzki's ring   . Tobacco abuse     Past Surgical History:  Procedure  Laterality Date  . CARDIAC CATHETERIZATION    . CHOLECYSTECTOMY    . COLONOSCOPY    . COLONOSCOPY N/A 09/18/2018   Procedure: COLONOSCOPY;  Surgeon: [name], MD;  [facility]: [facility];  Service: Endoscopy;  Laterality: N/A;  8:30am  . ESOPHAGOGASTRODUODENOSCOPY  03/25/2012   Procedure: ESOPHAGOGASTRODUODENOSCOPY (EGD);  Surgeon: [name], MD;  Location: [facility];  Service: Gastroenterology;  Laterality: N/A;  . INGUINAL HERNIA REPAIR     bilateral  . JOINT REPLACEMENT     ltknee arthroscopic,rt shoulder arthroscopic  . KNEE SURGERY     left arthroscopy  . LEFT HEART CATHETERIZATION WITH CORONARY ANGIOGRAM N/A 03/23/2012   Procedure: LEFT HEART CATHETERIZATION WITH CORONARY ANGIOGRAM;  Surgeon: [name], MD;  Location: [facility] CATH LAB;  Service: Cardiovascular;  Laterality: N/A;  . POLYPECTOMY  09/18/2018   Procedure: POLYPECTOMY;  Surgeon: [name], MD;  [facility]: [facility];  Service: Endoscopy;;  hepatic flexure (CSx2), cecal (CSx1), splenic flexure(CSx2)  . SHOULDER SURGERY     right    Current Medications: Outpatient Medications Prior to Visit  Medication Sig Dispense Refill  . acetaminophen (TYLENOL) 650 MG CR tablet Take 650-1,300 mg by mouth every 8 (eight) hours as needed for pain.    Marland Kitchen aspirin EC 81 MG tablet Take 81 mg by mouth daily.    . calcium carbonate (TUMS - DOSED IN MG ELEMENTAL CALCIUM)  500 MG chewable tablet Chew 1-2 tablets by mouth 3 (three) times daily as needed for indigestion or heartburn.    . enalapril (VASOTEC) 10 MG tablet Take 20 mg by mouth in the morning and at bedtime.     . meclizine (ANTIVERT) 25 MG tablet Take 25 mg by mouth 2 (two) times daily as needed for dizziness.     . metFORMIN (GLUCOPHAGE) 500 MG tablet Take 500 mg by mouth daily.   5  . Multiple Vitamin (MULTIVITAMIN WITH MINERALS) TABS Take 1 tablet by mouth daily. Centrum    . nitroGLYCERIN (NITROSTAT) 0.4 MG SL tablet Place 1 tablet (0.4 mg total) under the  tongue every 5 (five) minutes as needed for chest pain. (Not to exceed 3 in a 15 minute time frame.  If no relief after second dose, call 911. 25 tablet 3  . pantoprazole (PROTONIX) 40 MG tablet Take 40 mg by mouth in the morning and at bedtime.    . triamcinolone cream (KENALOG) 0.1 % Apply 1 application topically 2 (two) times daily as needed (skin irritation/rash).     Marland Kitchen amLODipine (NORVASC) 5 MG tablet Take 5 mg by mouth daily.    . simvastatin (ZOCOR) 40 MG tablet Take 40 mg by mouth every evening.    . metoprolol tartrate (LOPRESSOR) 25 MG tablet Take 1 tablet (25 mg total) by mouth as directed. Two hours prior to CT Scan (Patient not taking: Reported on 03/24/2020) 1 tablet 0   No facility-administered medications prior to visit.     Allergies:   Morphine, Morphine and related, Scopolamine, and Scopolamine hbr   Social History   Socioeconomic History  . Marital status: Widowed    Spouse name: Not on file  . Number of children: 3  . Years of education: Not on file  . Highest education level: Not on file  Occupational History  . Occupation: Truck driving (part time)    Employer: [employer]  Tobacco Use  . Smoking status: Current Every Day Smoker    Packs/day: 1.00    Years: 60.00    Pack years: 60.00    Types: Cigarettes    Start date: 08/23/1959  . Smokeless tobacco: Never Used  . Tobacco comment: He has quit many times.  Vaping Use  . Vaping Use: Never used  Substance and Sexual Activity  . Alcohol use: Yes    Alcohol/week: 1.0 standard drink    Types: 1 Shots of liquor per week    Comment: once in a while  . Drug use: No  . Sexual activity: Yes    Birth control/protection: None  Other Topics Concern  . Not on file  Social History Narrative   Lives with wife.     Social Determinants of Health   Financial Resource Strain:   . Difficulty of Paying Living Expenses:   Food Insecurity:   . Worried About Charity fundraiser in the Last Year:   . Arboriculturist  in the Last Year:   Transportation Needs:   . Film/video editor (Medical):   Marland Kitchen Lack of Transportation (Non-Medical):   Physical Activity:   . Days of Exercise per Week:   . Minutes of Exercise per Session:   Stress:   . Feeling of Stress :   Social Connections:   . Frequency of Communication with Friends and Family:   . Frequency of Social Gatherings with Friends and Family:   . Attends Religious Services:   . Active Member  of Clubs or Organizations:   . Attends Archivist Meetings:   Marland Kitchen Marital Status:      Family History:  The patient's family history includes Cancer in his father; Coronary artery disease (age of onset: 61) in his brother.   Review of Systems:   Please see the history of present illness.     General:  No chills, fever, night sweats or weight changes.  Cardiovascular:  No dyspnea on exertion, edema, orthopnea, palpitations, paroxysmal nocturnal dyspnea. Positive for chest pain.  Dermatological: No rash, lesions/masses Respiratory: No cough, dyspnea Urologic: No hematuria, dysuria Abdominal:   No nausea, vomiting, diarrhea, bright red blood per rectum, melena, or hematemesis Neurologic:  No visual changes, wkns, changes in mental status. All other systems reviewed and are otherwise negative except as noted above.   Physical Exam:    VS:  BP (!) 142/68   Pulse (!) 57   Ht 5\' 7"  (1.702 m)   Wt 165 lb 9.6 oz (75.1 kg)   SpO2 97%   BMI 25.94 kg/m    General: Well developed, well nourished,male appearing in no acute distress. Appears younger than stated age.  Head: Normocephalic, atraumatic, sclera non-icteric.  Neck: No carotid bruits. JVD not elevated.  Lungs: Respirations regular and unlabored, without wheezes or rales.  Heart: Regular rate and rhythm. No S3 or S4.  No murmur, no rubs, or gallops appreciated. Abdomen: Soft, non-tender, non-distended. No obvious abdominal masses. Msk:  Strength and tone appear normal for age. No obvious  joint deformities or effusions. Extremities: No clubbing or cyanosis. No lower extremity edema.  Distal pedal pulses are 2+ bilaterally. Neuro: Alert and oriented X 3. Moves all extremities spontaneously. No focal deficits noted. Psych:  Responds to questions appropriately with a normal affect. Skin: No rashes or lesions noted  Wt Readings from Last 3 Encounters:  03/24/20 165 lb 9.6 oz (75.1 kg)  02/22/20 165 lb 9.6 oz (75.1 kg)  09/18/18 168 lb (76.2 kg)      Studies/Labs Reviewed:   EKG:  EKG is ordered today.  The ekg ordered today demonstrates sinus bradycardia, HR 57 with no acute ST abnormalities.   Recent Labs: 03/09/2020: BUN 15; Creatinine, Ser 0.83; Potassium 4.3; Sodium 138   Lipid Panel    Component Value Date/Time   CHOL 148 03/24/2012 0515   TRIG 237 (H) 03/24/2012 0515   HDL 31 (L) 03/24/2012 0515   CHOLHDL 4.8 03/24/2012 0515   VLDL 47 (H) 03/24/2012 0515   LDLCALC 70 03/24/2012 0515    Additional studies/ records that were reviewed today include:   Cardiac Telemetry: 2016  Sinus rhythm with sinus arrhythmia.  No symptoms reported.   Coronary CT: 03/2020 FINDINGS: Non-cardiac: See separate report from [facility]. No significant findings on limited lung and soft tissue windows.  Calcium score: Calcium noted in LAD and Circumflex  Coronary Arteries: Left  dominant with no anomalies  LM: Normal  LAD: 50-69% % ostial, proximal and mid calcific plaque  D1: Small vessel  D2: Normal  Circumflex: 1-24% calcified plaque in proximal and mid vessel  OM1: Normal  OM2: Normal  PDA: Normal  PLB: Normal  RCA: Small non dominant  IMPRESSION: 1. Calcium score 330 which is 36 th percentile for age and sex  2.  Normal aortic root diameter 3.7 cm  3.  CAD RADS 3 CAD possibly obstructive in LAD Study sent for FFR CT  FINDINGS: Normal FFR CT in RCA and circumflex  FFR CT O.94  in proximal LAD  FFR CT 0.87 in mid  LAD  FFR CT 0.79 in distal LAD and 0.71 in apical LAD  IMPRESSION: FFR CT positive in distal LAD only   Assessment:    1. Coronary artery disease involving native coronary artery of native heart with other form of angina pectoris (HCC)   2. Chest pain, unspecified type   3. Pre-op testing   4. Essential hypertension   5. Hyperlipidemia LDL goal <70   6. Tobacco use      Plan:   In order of problems listed above:  1. Chest Pain/ Abnormal Coronary CT - His episodes of chest pain are somewhat atypical as they occur at rest and are not associated with exertion but they do awake him from sleep. Recent Coronary CT showed possible obstructive disease in the LAD and FFR was positive for stenosis along the distal LAD and Dr. Johnsie Cancel recommended a cardiac catheterization for definitive evaluation. - Results reviewed with the patient today and he agrees to proceed. The patient understands that risks include but are not limited to stroke (1 in 1000), death (1 in 35), kidney failure [usually temporary] (1 in 500), bleeding (1 in 200), allergic reaction [possibly serious] (1 in 200). Creatinine was stable at 0.83 by recent labs on 03/09/2020. Will obtain a CBC. Catheterization scheduled for next week with Dr. Irish Lack. - Continue ASA and statin therapy (with dose adjustment as outlined below).   2. HTN - BP is slightly elevated at 142/68 during today's visit but has been well-controlled at others. Will continue Amlodipine 5mg  daily and Enalapril 20mg  BID for now. If BP remains above goal, would further titrate Amlodipine.   3. HLD - No recent labs available for recent and a copy has been requested from his PCP. Goal LDL is less than 70 with documented coronary calcifications. He is currently on Simvastatin 40mg  daily but given the concurrent use of Amlodipine, the maximum recommended dose of this is 20mg  daily. I recommended he reduce his Simvastatin to 20mg  daily. If LDL above goal by repeat  labs, would plan to switch to Crestor.   4. Tobacco Use - He continues to smoke approximately 1 ppd. Cessation advised.   Medication Adjustments/Labs and Tests Ordered: Current medicines are reviewed at length with the patient today.  Concerns regarding medicines are outlined above.  Medication changes, Labs and Tests ordered today are listed in the Patient Instructions below. Patient Instructions  Medication Instructions:  DECREASE SIMVASTATIN TO 20 MG DAILY AT DINNER  *If you need a refill on your cardiac medications before your next appointment, please call your pharmacy*   Lab Work: CBC ON Monday 7/19  If you have labs (blood work) drawn today and your tests are completely normal, you will receive your results only by: Marland Kitchen MyChart Message (if you have MyChart) OR . A paper copy in the mail If you have any lab test that is abnormal or we need to change your treatment, we will call you to review the results.   Testing/Procedures: Your physician has requested that you have a cardiac catheterization. Cardiac catheterization is used to diagnose and/or treat various heart conditions. Doctors may recommend this procedure for a number of different reasons. The most common reason is to evaluate chest pain. Chest pain can be a symptom of coronary artery disease (CAD), and cardiac catheterization can show whether plaque is narrowing or blocking your heart's arteries. This procedure is also used to evaluate the valves, as well as measure the  blood flow and oxygen levels in different parts of your heart. For further information please visit [email or web address].[email or web address]. Please follow instruction sheet, as given.     Follow-Up: At [facility], you and your health needs are our priority.  As part of our continuing mission to provide you with exceptional heart care, we have created designated Provider Care Teams.  These Care Teams include your primary Cardiologist (physician) and Advanced Practice  Providers (APPs -  Physician Assistants and Nurse Practitioners) who all work together to provide you with the care you need, when you need it.  We recommend signing up for the patient portal called "MyChart".  Sign up information is provided on this After Visit Summary.  MyChart is used to connect with patients for Virtual Visits (Telemedicine).  Patients are able to view lab/test results, encounter notes, upcoming appointments, etc.  Non-urgent messages can be sent to your provider as well.   To learn more about what you can do with MyChart, go to [email or web address].    Your next appointment:   3-4  week(s)  The format for your next appointment:   In Person  Provider:   Bernerd Pho, PA-C   Other Instructions NONE     Signed, Erma Heritage, PA-C  03/25/2020 8:55 AM    [place]. [address] [place], [place] [address] Phone: [phone] Fax: [phone]

## 2020-03-24 NOTE — Patient Instructions (Signed)
Medication Instructions:  DECREASE SIMVASTATIN TO 20 MG DAILY AT DINNER  *If you need a refill on your cardiac medications before your next appointment, please call your pharmacy*   Lab Work: CBC ON Monday 7/19  If you have labs (blood work) drawn today and your tests are completely normal, you will receive your results only by:  [place] (if you have MyChart) OR  A paper copy in the mail If you have any lab test that is abnormal or we need to change your treatment, we will call you to review the results.   Testing/Procedures: Your physician has requested that you have a cardiac catheterization. Cardiac catheterization is used to diagnose and/or treat various heart conditions. Doctors may recommend this procedure for a number of different reasons. The most common reason is to evaluate chest pain. Chest pain can be a symptom of coronary artery disease (CAD), and cardiac catheterization can show whether plaque is narrowing or blocking your hearts arteries. This procedure is also used to evaluate the valves, as well as measure the blood flow and oxygen levels in different parts of your heart. For further information please visit [email or web address]. Please follow instruction sheet, as given.     Follow-Up: At [facility], you and your health needs are our priority.  As part of our continuing mission to provide you with exceptional heart care, we have created designated Provider Care Teams.  These Care Teams include your primary Cardiologist (physician) and Advanced Practice Providers (APPs -  Physician Assistants and Nurse Practitioners) who all work together to provide you with the care you need, when you need it.  We recommend signing up for the patient portal called "MyChart".  Sign up information is provided on this After Visit Summary.  MyChart is used to connect with patients for Virtual Visits (Telemedicine).  Patients are able to view lab/test results, encounter notes,  upcoming appointments, etc.  Non-urgent messages can be sent to your provider as well.   To learn more about what you can do with MyChart, go to [email or web address].    Your next appointment:   3-4  week(s)  The format for your next appointment:   In Person  Provider:   Bernerd Pho, PA-C   Other Instructions NONE

## 2020-03-25 ENCOUNTER — Encounter: Payer: Self-pay | Admitting: Student

## 2020-03-27 ENCOUNTER — Telehealth: Payer: Self-pay | Admitting: *Deleted

## 2020-03-27 ENCOUNTER — Other Ambulatory Visit (HOSPITAL_COMMUNITY)
Admission: RE | Admit: 2020-03-27 | Discharge: 2020-03-27 | Disposition: A | Discharge status: Home / Self Care | Payer: Medicare HMO | Source: Ambulatory Visit | Attending: Student | Admitting: Student

## 2020-03-27 DIAGNOSIS — Z01818 Encounter for other preprocedural examination: Secondary | ICD-10-CM | POA: Insufficient documentation

## 2020-03-27 DIAGNOSIS — R079 Chest pain, unspecified: Secondary | ICD-10-CM | POA: Insufficient documentation

## 2020-03-27 LAB — CBC WITH DIFFERENTIAL/PLATELET
Abs Immature Granulocytes: 0.03 10*3/uL (ref 0.00–0.07)
Basophils Absolute: 0 10*3/uL (ref 0.0–0.1)
Basophils Relative: 1 %
Eosinophils Absolute: 0.1 10*3/uL (ref 0.0–0.5)
Eosinophils Relative: 2 %
HCT: 44.7 % (ref 39.0–52.0)
Hemoglobin: 14.6 g/dL (ref 13.0–17.0)
Immature Granulocytes: 0 %
Lymphocytes Relative: 23 %
Lymphs Abs: 1.9 10*3/uL (ref 0.7–4.0)
MCH: 30.3 pg (ref 26.0–34.0)
MCHC: 32.7 g/dL (ref 30.0–36.0)
MCV: 92.7 fL (ref 80.0–100.0)
Monocytes Absolute: 0.6 10*3/uL (ref 0.1–1.0)
Monocytes Relative: 7 %
Neutro Abs: 5.6 10*3/uL (ref 1.7–7.7)
Neutrophils Relative %: 67 %
Platelets: 194 10*3/uL (ref 150–400)
RBC: 4.82 MIL/uL (ref 4.22–5.81)
RDW: 13.3 % (ref 11.5–15.5)
WBC: 8.3 10*3/uL (ref 4.0–10.5)
nRBC: 0 % (ref 0.0–0.2)

## 2020-03-27 NOTE — Telephone Encounter (Signed)
No answer, voicemail message. 

## 2020-03-27 NOTE — Telephone Encounter (Signed)
Pt contacted pre-catheterization scheduled at [facility] for: Tuesday March 28, 2020 12 Noon Verified arrival time and place: [facility]) at: 10 AM   No solid food after midnight prior to cath, clear liquids until 5 AM day of procedure.  Hold: Metformin-day of procedure and 48 hours post procedure  Except hold medications AM meds can be  taken pre-cath with sips of water including: ASA 81 mg   Confirmed patient has responsible adult to drive home post procedure and observe 24 hours after arriving home:   You are allowed ONE visitor in the waiting room during your procedure. Both you and your visitor must wear a mask once you enter the hospital.      COVID-19 Pre-Screening Questions:   In the past 14 days have you had a new cough associated with shortness of breath, fever (100.4 or greater) or sudden loss of taste or sense of smell?  In the past 14 days have you been around anyone with known Covid 19?  Any international travel in the past 14 days?  Have you been vaccinated for COVID-19?   LMTCB to review procedure instructions with patient.

## 2020-03-27 NOTE — Telephone Encounter (Signed)
Unable to speak with patient to review procedure instructions, son answered phone, no DPR on file.

## 2020-03-28 ENCOUNTER — Ambulatory Visit: Payer: Medicare HMO | Admitting: Family Medicine

## 2020-03-28 ENCOUNTER — Other Ambulatory Visit: Payer: Self-pay

## 2020-03-28 ENCOUNTER — Ambulatory Visit (HOSPITAL_COMMUNITY)
Admission: RE | Admit: 2020-03-28 | Discharge: 2020-03-29 | Disposition: A | Discharge status: Home / Self Care | Payer: Medicare HMO | Attending: Interventional Cardiology | Admitting: Interventional Cardiology

## 2020-03-28 ENCOUNTER — Encounter (HOSPITAL_COMMUNITY)
Admission: RE | Disposition: A | Discharge status: Home / Self Care | Payer: Self-pay | Source: Home / Self Care | Attending: Interventional Cardiology

## 2020-03-28 DIAGNOSIS — Z79899 Other long term (current) drug therapy: Secondary | ICD-10-CM | POA: Insufficient documentation

## 2020-03-28 DIAGNOSIS — Z8249 Family history of ischemic heart disease and other diseases of the circulatory system: Secondary | ICD-10-CM | POA: Insufficient documentation

## 2020-03-28 DIAGNOSIS — Z955 Presence of coronary angioplasty implant and graft: Secondary | ICD-10-CM

## 2020-03-28 DIAGNOSIS — I10 Essential (primary) hypertension: Secondary | ICD-10-CM | POA: Diagnosis present

## 2020-03-28 DIAGNOSIS — I2510 Atherosclerotic heart disease of native coronary artery without angina pectoris: Secondary | ICD-10-CM

## 2020-03-28 DIAGNOSIS — E118 Type 2 diabetes mellitus with unspecified complications: Secondary | ICD-10-CM

## 2020-03-28 DIAGNOSIS — E119 Type 2 diabetes mellitus without complications: Secondary | ICD-10-CM | POA: Insufficient documentation

## 2020-03-28 DIAGNOSIS — Z7982 Long term (current) use of aspirin: Secondary | ICD-10-CM | POA: Insufficient documentation

## 2020-03-28 DIAGNOSIS — I25119 Atherosclerotic heart disease of native coronary artery with unspecified angina pectoris: Secondary | ICD-10-CM | POA: Diagnosis not present

## 2020-03-28 DIAGNOSIS — E785 Hyperlipidemia, unspecified: Secondary | ICD-10-CM | POA: Diagnosis not present

## 2020-03-28 DIAGNOSIS — F17210 Nicotine dependence, cigarettes, uncomplicated: Secondary | ICD-10-CM | POA: Diagnosis not present

## 2020-03-28 DIAGNOSIS — Z888 Allergy status to other drugs, medicaments and biological substances status: Secondary | ICD-10-CM | POA: Diagnosis not present

## 2020-03-28 DIAGNOSIS — Z885 Allergy status to narcotic agent status: Secondary | ICD-10-CM | POA: Insufficient documentation

## 2020-03-28 DIAGNOSIS — I209 Angina pectoris, unspecified: Secondary | ICD-10-CM | POA: Diagnosis present

## 2020-03-28 DIAGNOSIS — Z7984 Long term (current) use of oral hypoglycemic drugs: Secondary | ICD-10-CM | POA: Insufficient documentation

## 2020-03-28 HISTORY — PX: LEFT HEART CATH AND CORONARY ANGIOGRAPHY: CATH118249

## 2020-03-28 HISTORY — PX: CORONARY PRESSURE/FFR STUDY: CATH118243

## 2020-03-28 HISTORY — PX: CORONARY STENT INTERVENTION: CATH118234

## 2020-03-28 LAB — GLUCOSE, CAPILLARY
Glucose-Capillary: 133 mg/dL — ABNORMAL HIGH (ref 70–99)
Glucose-Capillary: 86 mg/dL (ref 70–99)
Glucose-Capillary: 118 mg/dL — ABNORMAL HIGH (ref 70–99)

## 2020-03-28 LAB — POCT ACTIVATED CLOTTING TIME
Activated Clotting Time: 241 seconds
Activated Clotting Time: 301 seconds
Activated Clotting Time: 312 seconds

## 2020-03-28 SURGERY — LEFT HEART CATH AND CORONARY ANGIOGRAPHY
Anesthesia: LOCAL

## 2020-03-28 MED ORDER — SODIUM CHLORIDE 0.9% FLUSH: 3.0000 mL | INTRAVENOUS | Status: DC | PRN

## 2020-03-28 MED ORDER — AMLODIPINE BESYLATE 5 MG PO TABS
5.0000 mg | ORAL_TABLET | Freq: Every day | ORAL | Status: DC
  Administered 2020-03-29: 5 mg via ORAL
  Filled 2020-03-28: qty 1

## 2020-03-28 MED ORDER — MECLIZINE HCL 25 MG PO TABS: 25.0000 mg | ORAL_TABLET | Freq: Two times a day (BID) | ORAL | Status: DC | PRN

## 2020-03-28 MED ORDER — NITROGLYCERIN 0.4 MG SL SUBL: 0.4000 mg | SUBLINGUAL_TABLET | SUBLINGUAL | Status: DC | PRN

## 2020-03-28 MED ORDER — MIDAZOLAM HCL 2 MG/2ML IJ SOLN
INTRAMUSCULAR | Status: DC | PRN
  Administered 2020-03-28: 2 mg via INTRAVENOUS
  Administered 2020-03-28: 1 mg via INTRAVENOUS

## 2020-03-28 MED ORDER — SODIUM CHLORIDE 0.9 % WEIGHT BASED INFUSION: 1.0000 mL/kg/h | INTRAVENOUS | Status: DC

## 2020-03-28 MED ORDER — ASPIRIN 81 MG PO CHEW: 81.0000 mg | CHEWABLE_TABLET | ORAL | Status: DC

## 2020-03-28 MED ORDER — FENTANYL CITRATE (PF) 100 MCG/2ML IJ SOLN
INTRAMUSCULAR | Status: AC
  Filled 2020-03-28: qty 2

## 2020-03-28 MED ORDER — HEPARIN SODIUM (PORCINE) 1000 UNIT/ML IJ SOLN
INTRAMUSCULAR | Status: AC
  Filled 2020-03-28: qty 1

## 2020-03-28 MED ORDER — NITROGLYCERIN 1 MG/10 ML FOR IR/CATH LAB
INTRA_ARTERIAL | Status: DC | PRN
  Administered 2020-03-28: 300 mL via INTRA_ARTERIAL
  Administered 2020-03-28: 200 ug via INTRA_ARTERIAL
  Administered 2020-03-28 (×2): 200 ug via INTRACORONARY

## 2020-03-28 MED ORDER — FENTANYL CITRATE (PF) 100 MCG/2ML IJ SOLN
INTRAMUSCULAR | Status: DC | PRN
  Administered 2020-03-28: 50 ug via INTRAVENOUS
  Administered 2020-03-28 (×2): 25 ug via INTRAVENOUS

## 2020-03-28 MED ORDER — NITROGLYCERIN 1 MG/10 ML FOR IR/CATH LAB
INTRA_ARTERIAL | Status: AC
  Filled 2020-03-28: qty 10

## 2020-03-28 MED ORDER — SODIUM CHLORIDE 0.9 % WEIGHT BASED INFUSION
3.0000 mL/kg/h | INTRAVENOUS | Status: DC
  Administered 2020-03-28: 3 mL/kg/h via INTRAVENOUS

## 2020-03-28 MED ORDER — SODIUM CHLORIDE 0.9 % IV SOLN: INTRAVENOUS | Status: AC

## 2020-03-28 MED ORDER — CLOPIDOGREL BISULFATE 300 MG PO TABS
ORAL_TABLET | ORAL | Status: DC | PRN
  Administered 2020-03-28: 600 mg via ORAL

## 2020-03-28 MED ORDER — SODIUM CHLORIDE 0.9 % IV SOLN: 250.0000 mL | INTRAVENOUS | Status: DC | PRN

## 2020-03-28 MED ORDER — FAMOTIDINE IN NACL 20-0.9 MG/50ML-% IV SOLN
INTRAVENOUS | Status: AC | PRN
  Administered 2020-03-28: 20 mg via INTRAVENOUS

## 2020-03-28 MED ORDER — HYDRALAZINE HCL 20 MG/ML IJ SOLN: 10.0000 mg | INTRAMUSCULAR | Status: AC | PRN

## 2020-03-28 MED ORDER — CLOPIDOGREL BISULFATE 75 MG PO TABS
75.0000 mg | ORAL_TABLET | Freq: Every day | ORAL | Status: DC
  Administered 2020-03-29: 75 mg via ORAL
  Filled 2020-03-28: qty 1

## 2020-03-28 MED ORDER — CLOPIDOGREL BISULFATE 300 MG PO TABS
ORAL_TABLET | ORAL | Status: AC
  Filled 2020-03-28: qty 2

## 2020-03-28 MED ORDER — SIMVASTATIN 20 MG PO TABS
20.0000 mg | ORAL_TABLET | Freq: Every evening | ORAL | Status: DC
  Administered 2020-03-28: 20 mg via ORAL
  Filled 2020-03-28: qty 1

## 2020-03-28 MED ORDER — CALCIUM CARBONATE ANTACID 500 MG PO CHEW: 1.0000 | CHEWABLE_TABLET | Freq: Three times a day (TID) | ORAL | Status: DC | PRN

## 2020-03-28 MED ORDER — LIDOCAINE HCL (PF) 1 % IJ SOLN
INTRAMUSCULAR | Status: DC | PRN
  Administered 2020-03-28: 2 mL

## 2020-03-28 MED ORDER — LIDOCAINE HCL (PF) 1 % IJ SOLN
INTRAMUSCULAR | Status: AC
  Filled 2020-03-28: qty 30

## 2020-03-28 MED ORDER — ADULT MULTIVITAMIN W/MINERALS CH
1.0000 | ORAL_TABLET | Freq: Every day | ORAL | Status: DC
  Administered 2020-03-28 – 2020-03-29 (×2): 1 via ORAL
  Filled 2020-03-28 (×2): qty 1

## 2020-03-28 MED ORDER — SODIUM CHLORIDE 0.9% FLUSH: 3.0000 mL | Freq: Two times a day (BID) | INTRAVENOUS | Status: DC

## 2020-03-28 MED ORDER — ASPIRIN EC 81 MG PO TBEC: 81.0000 mg | DELAYED_RELEASE_TABLET | Freq: Every day | ORAL | Status: DC

## 2020-03-28 MED ORDER — HEPARIN (PORCINE) IN NACL 1000-0.9 UT/500ML-% IV SOLN
INTRAVENOUS | Status: AC
  Filled 2020-03-28: qty 1000

## 2020-03-28 MED ORDER — ONDANSETRON HCL 4 MG/2ML IJ SOLN: 4.0000 mg | Freq: Four times a day (QID) | INTRAMUSCULAR | Status: DC | PRN

## 2020-03-28 MED ORDER — VERAPAMIL HCL 2.5 MG/ML IV SOLN
INTRAVENOUS | Status: DC | PRN
  Administered 2020-03-28: 10 mL via INTRA_ARTERIAL

## 2020-03-28 MED ORDER — ASPIRIN 81 MG PO CHEW
81.0000 mg | CHEWABLE_TABLET | Freq: Every day | ORAL | Status: DC
  Administered 2020-03-29: 81 mg via ORAL
  Filled 2020-03-28: qty 1

## 2020-03-28 MED ORDER — IOHEXOL 350 MG/ML SOLN
INTRAVENOUS | Status: DC | PRN
  Administered 2020-03-28: 95 mL

## 2020-03-28 MED ORDER — VERAPAMIL HCL 2.5 MG/ML IV SOLN
INTRAVENOUS | Status: AC
  Filled 2020-03-28: qty 2

## 2020-03-28 MED ORDER — HEPARIN SODIUM (PORCINE) 1000 UNIT/ML IJ SOLN
INTRAMUSCULAR | Status: DC | PRN
  Administered 2020-03-28 (×2): 3000 [IU] via INTRAVENOUS
  Administered 2020-03-28: 4000 [IU] via INTRAVENOUS
  Administered 2020-03-28: 2000 [IU] via INTRAVENOUS
  Administered 2020-03-28: 4000 [IU] via INTRAVENOUS

## 2020-03-28 MED ORDER — LABETALOL HCL 5 MG/ML IV SOLN: 10.0000 mg | INTRAVENOUS | Status: AC | PRN

## 2020-03-28 MED ORDER — HEPARIN (PORCINE) IN NACL 1000-0.9 UT/500ML-% IV SOLN
INTRAVENOUS | Status: DC | PRN
  Administered 2020-03-28 (×2): 500 mL

## 2020-03-28 MED ORDER — PANTOPRAZOLE SODIUM 40 MG PO TBEC
40.0000 mg | DELAYED_RELEASE_TABLET | Freq: Every day | ORAL | Status: DC
  Administered 2020-03-28 – 2020-03-29 (×2): 40 mg via ORAL
  Filled 2020-03-28 (×2): qty 1

## 2020-03-28 MED ORDER — ACETAMINOPHEN 325 MG PO TABS: 650.0000 mg | ORAL_TABLET | ORAL | Status: DC | PRN

## 2020-03-28 MED ORDER — MIDAZOLAM HCL 2 MG/2ML IJ SOLN
INTRAMUSCULAR | Status: AC
  Filled 2020-03-28: qty 2

## 2020-03-28 MED ORDER — FAMOTIDINE IN NACL 20-0.9 MG/50ML-% IV SOLN
INTRAVENOUS | Status: AC
  Filled 2020-03-28: qty 50

## 2020-03-28 SURGICAL SUPPLY — 20 items
BALLN SAPPHIRE 2.5X20 (BALLOONS) ×2
BALLN SAPPHIRE ~~LOC~~ 3.5X15 (BALLOONS) ×1 IMPLANT
BALLOON SAPPHIRE 2.5X20 (BALLOONS) IMPLANT
CATH 5FR JL3.5 JR4 ANG PIG MP (CATHETERS) ×1 IMPLANT
CATH LAUNCHER 6FR EBU3.5 (CATHETERS) ×1 IMPLANT
CATH OPTICROSS HD (CATHETERS) ×1 IMPLANT
DEVICE RAD COMP TR BAND LRG (VASCULAR PRODUCTS) ×1 IMPLANT
GLIDESHEATH SLEND SS 6F .021 (SHEATH) ×1 IMPLANT
GUIDEWIRE INQWIRE 1.5J.035X260 (WIRE) IMPLANT
GUIDEWIRE PRESSURE COMET II (WIRE) ×1 IMPLANT
INQWIRE 1.5J .035X260CM (WIRE) ×2
KIT ENCORE 26 ADVANTAGE (KITS) ×1 IMPLANT
KIT HEART LEFT (KITS) ×2 IMPLANT
KIT HEMO VALVE WATCHDOG (MISCELLANEOUS) ×1 IMPLANT
PACK CARDIAC CATHETERIZATION (CUSTOM PROCEDURE TRAY) ×2 IMPLANT
SLED PULL BACK IVUS (MISCELLANEOUS) ×1 IMPLANT
STENT RESOLUTE ONYX 3.5X26 (Permanent Stent) ×1 IMPLANT
TRANSDUCER W/STOPCOCK (MISCELLANEOUS) ×2 IMPLANT
TUBING CIL FLEX 10 FLL-RA (TUBING) ×2 IMPLANT
WIRE HI TORQ VERSACORE-J 145CM (WIRE) ×1 IMPLANT

## 2020-03-28 NOTE — Interval H&P Note (Signed)
Cath Lab Visit (complete for each Cath Lab visit)  Clinical Evaluation Leading to the Procedure:   ACS: No.  Non-ACS:    Anginal Classification: CCS II  Anti-ischemic medical therapy: Minimal Therapy (1 class of medications)  Non-Invasive Test Results: Intermediate-risk stress test findings: cardiac mortality 1-3%/year  Prior CABG: No previous CABG   Prox LAD disease on CT.  Atypical symptoms.    History and Physical Interval Note:  03/28/2020 2:08 PM  Richard Rocha  has presented today for surgery, with the diagnosis of CAD chest pain.  The various methods of treatment have been discussed with the patient and family. After consideration of risks, benefits and other options for treatment, the patient has consented to  Procedure(s): LEFT HEART CATH AND CORONARY ANGIOGRAPHY (N/A) as a surgical intervention.  The patient's history has been reviewed, patient examined, no change in status, stable for surgery.  I have reviewed the patient's chart and labs.  Questions were answered to the patient's satisfaction.     Larae Grooms

## 2020-03-29 ENCOUNTER — Encounter (HOSPITAL_COMMUNITY): Payer: Self-pay | Admitting: Interventional Cardiology

## 2020-03-29 DIAGNOSIS — E785 Hyperlipidemia, unspecified: Secondary | ICD-10-CM | POA: Diagnosis not present

## 2020-03-29 DIAGNOSIS — I2510 Atherosclerotic heart disease of native coronary artery without angina pectoris: Secondary | ICD-10-CM

## 2020-03-29 DIAGNOSIS — E118 Type 2 diabetes mellitus with unspecified complications: Secondary | ICD-10-CM

## 2020-03-29 DIAGNOSIS — I10 Essential (primary) hypertension: Secondary | ICD-10-CM | POA: Diagnosis not present

## 2020-03-29 DIAGNOSIS — Z955 Presence of coronary angioplasty implant and graft: Secondary | ICD-10-CM

## 2020-03-29 DIAGNOSIS — F17210 Nicotine dependence, cigarettes, uncomplicated: Secondary | ICD-10-CM | POA: Diagnosis not present

## 2020-03-29 DIAGNOSIS — I25119 Atherosclerotic heart disease of native coronary artery with unspecified angina pectoris: Secondary | ICD-10-CM | POA: Diagnosis not present

## 2020-03-29 DIAGNOSIS — I25110 Atherosclerotic heart disease of native coronary artery with unstable angina pectoris: Secondary | ICD-10-CM

## 2020-03-29 DIAGNOSIS — Z79899 Other long term (current) drug therapy: Secondary | ICD-10-CM | POA: Diagnosis not present

## 2020-03-29 DIAGNOSIS — I209 Angina pectoris, unspecified: Secondary | ICD-10-CM

## 2020-03-29 DIAGNOSIS — Z7984 Long term (current) use of oral hypoglycemic drugs: Secondary | ICD-10-CM | POA: Diagnosis not present

## 2020-03-29 DIAGNOSIS — Z7982 Long term (current) use of aspirin: Secondary | ICD-10-CM | POA: Diagnosis not present

## 2020-03-29 DIAGNOSIS — Z885 Allergy status to narcotic agent status: Secondary | ICD-10-CM | POA: Diagnosis not present

## 2020-03-29 DIAGNOSIS — E119 Type 2 diabetes mellitus without complications: Secondary | ICD-10-CM | POA: Diagnosis not present

## 2020-03-29 LAB — BASIC METABOLIC PANEL
Anion gap: 10 (ref 5–15)
BUN: 10 mg/dL (ref 8–23)
CO2: 26 mmol/L (ref 22–32)
Calcium: 9.3 mg/dL (ref 8.9–10.3)
Chloride: 104 mmol/L (ref 98–111)
Creatinine, Ser: 0.87 mg/dL (ref 0.61–1.24)
GFR calc Af Amer: >60 mL/min (ref >60)
GFR calc non Af Amer: >60 mL/min (ref >60)
Glucose, Bld: 194 mg/dL — ABNORMAL HIGH (ref 70–99)
Potassium: 3.7 mmol/L (ref 3.5–5.1)
Sodium: 140 mmol/L (ref 135–145)

## 2020-03-29 LAB — CBC
HCT: 43.7 % (ref 39.0–52.0)
Hemoglobin: 14.4 g/dL (ref 13.0–17.0)
MCH: 30.3 pg (ref 26.0–34.0)
MCHC: 33 g/dL (ref 30.0–36.0)
MCV: 91.8 fL (ref 80.0–100.0)
Platelets: 192 10*3/uL (ref 150–400)
RBC: 4.76 MIL/uL (ref 4.22–5.81)
RDW: 13.5 % (ref 11.5–15.5)
WBC: 9.7 10*3/uL (ref 4.0–10.5)
nRBC: 0 % (ref 0.0–0.2)

## 2020-03-29 MED ORDER — ROSUVASTATIN CALCIUM 20 MG PO TABS
40.0000 mg | ORAL_TABLET | Freq: Every day | ORAL | Status: DC
  Administered 2020-03-29: 40 mg via ORAL
  Filled 2020-03-29: qty 2

## 2020-03-29 MED ORDER — CLOPIDOGREL BISULFATE 75 MG PO TABS: 75.0000 mg | ORAL_TABLET | Freq: Every day | ORAL | 11 refills | Status: DC

## 2020-03-29 MED ORDER — ROSUVASTATIN CALCIUM 40 MG PO TABS: 40.0000 mg | ORAL_TABLET | Freq: Every day | ORAL | 3 refills | Status: DC

## 2020-03-29 MED FILL — CLOPIDOGREL 75 MG TABLET: 75 | 30 days supply | Qty: 30 | Fill #0

## 2020-03-29 MED FILL — ROSUVASTATIN CALCIUM 40 MG: 40 | 30 days supply | Qty: 30 | Fill #0

## 2020-03-29 NOTE — Progress Notes (Signed)
CARDIAC REHAB PHASE I   PRE:  Rate/Rhythm: 60 SR  BP:  Sitting: 142/78      SaO2: 98 RA  MODE:  Ambulation: 330 ft   POST:  Rate/Rhythm: 72 SR  BP:  Sitting: 149/85    SaO2: 97 RA  Pt ambulated 378ft in hallway independently with steady gait. Pt denies CP, dizziness, or SOB. Pt educated on importance of ASA and Plavix. Pt given heart healthy and diabetic diets. Encouraged smoking cessation. Reviewed site care, restrictions, and exercise guidelines. Will refer to CRP II [place].   [id number] Rufina Falco, RN BSN 03/29/2020 8:47 AM

## 2020-03-29 NOTE — Discharge Instructions (Signed)
Post Cardiac Catheterization: NO HEAVY LIFTING OR SEXUAL ACTIVITY X 7 DAYS. NO DRIVING X 3-5 DAYS. NO SOAKING BATHS, HOT TUBS, POOLS, ETC., X 7 DAYS.  Radial Site Care: Refer to this sheet in the next few weeks. These instructions provide you with information on caring for yourself after your procedure. Your caregiver may also give you more specific instructions. Your treatment has been planned according to current medical practices, but problems sometimes occur. Call your caregiver if you have any problems or questions after your procedure. HOME CARE INSTRUCTIONS  You may shower the day after the procedure.Remove the bandage (dressing) and gently wash the site with plain soap and water.Gently pat the site dry.   Do not apply powder or lotion to the site.   Do not submerge the affected site in water for 3 to 5 days.   Inspect the site at least twice daily.   Do not flex or bend the affected arm for 24 hours.   No lifting over 5 pounds (2.3 kg) for 5 days after your procedure.   Do not drive home if you are discharged the same day of the procedure. Have someone else drive you.  What to expect:  Any bruising will usually fade within 1 to 2 weeks.   Blood that collects in the tissue (hematoma) may be painful to the touch. It should usually decrease in size and tenderness within 1 to 2 weeks.  SEEK IMMEDIATE MEDICAL CARE IF:  You have unusual pain at the radial site.   You have redness, warmth, swelling, or pain at the radial site.   You have drainage (other than a small amount of blood on the dressing).   You have chills.   You have a fever or persistent symptoms for more than 72 hours.   You have a fever and your symptoms suddenly get worse.   Your arm becomes pale, cool, tingly, or numb.   You have heavy bleeding from the site. Hold pressure on the site.    

## 2020-03-29 NOTE — Discharge Summary (Addendum)
Discharge Summary    Patient ID: Richard Rocha MRN: [id number]; DOB: 1941-06-03  Admit date: 03/28/2020 Discharge date: 03/29/2020  Primary Care Provider: Monico Blitz, MD  Primary Cardiologist: Jenkins Rouge, MD Primary Electrophysiologist:  None   Discharge Diagnoses    Principal Problem:   Angina pectoris [facility]) Active Problems:   HTN (hypertension)   Dyslipidemia   Type 2 diabetes mellitus with complication, without long-term current use of insulin (HCC)   CAD (coronary artery disease)    Diagnostic Studies/Procedures    Left Heart Catheterization 03/28/2020:  Ost LAD to Prox LAD lesion is 30% stenosed.  Mid LAD lesion is 70% stenosed. Significant by DFR. Large plaque burden by IVUS.  A drug-eluting stent was successfully placed using a STENT RESOLUTE ONYX 3.5X26. Stent optimized with IVUS.  Post intervention, there is a 0% residual stenosis.  Dist LAD lesion is 30% stenosed.  The left ventricular systolic function is normal.  LV end diastolic pressure is normal.  The left ventricular ejection fraction is 55-65% by visual estimate.  There is no aortic valve stenosis.   Continue aggressive secondary prevention including smoking cessation.  Watch overnight.  Plan for discharge tomorrow.  Diagnostic Dominance: Left  Intervention     _____________   History of Present Illness     Richard Rocha is a 79 y.o. male with a history of hypertension, hyperlipidemia, type 2 diabetes mellitus, and tobacco use. He was referred to Dr. Johnsie Cancel in 02/2020 for evaluation of chest pain. At that visit, he stated he had a normal heart cath back in 2013 but reported recurrent episodes of chest pain the last several days which were relieved with sublingual Nitro. Coronary CT was ordered for further evaluation and showed possible obstructive disease in LAD which was sent for FFR. FFR was positive for stenosis along the distal LAD and Dr. Johnsie Cancel recommended a cardiac  catheterization for definitive evaluation. Patient was seen by Bernerd Pho, PA-C, on 03/24/2020 to discuss this. At that visit, he reported that he was still having intermittent episodes of chest discomfort typically at night which awake him from sleep. Cardiac catheterization was arranged for further evaluation.   Hospital Course     Consultants: None  Patient presented for outpatient cardiac catheterization on 03/28/2020. Cath showed 30% stenosis of ostial to proximal LAD followed by 70% stenosis of mid LAD with large plaque burden. (significant by DFR). Patient underwent successful PCI with DES to this lesion. LVEDP and LVEF normal. Patient tolerated procedure well. Admitted overnight for observation. Right radial cath site stable with no signs of hematoma. Renal function stable. Able to ambulated with Cardiac Rehab without any problems. Continue dual antiplatelet therapy with Aspirin 81mg  daily and Plavix 75mg  daily. Home Simvastatin swtiched to Crestor 40mg  daily. Will need repeat lipid panel and LFTs in 6-8 weeks. Continue all other home medications. Metformin can be restarted 48 hours after cardiac catheterization.  Patient seen and examined by Dr. Ellyn Hack today and determined to be stable for discharge. Outpatient follow-up has been arranged. Medications as below. He works as a Administrator. Discussed with Dr. Ellyn Hack, he can return to work in 1 week. I will provide a work note.  Did the patient have an acute coronary syndrome (MI, NSTEMI, STEMI, etc) this admission?:  No                               Did the patient have a percutaneous coronary intervention (  stent / angioplasty)?:  Yes.     Cath/PCI Registry Performance & Quality Measures: 1. Aspirin prescribed? - Yes 2. ADP Receptor Inhibitor (Plavix/Clopidogrel, Brilinta/Ticagrelor or Effient/Prasugrel) prescribed (includes medically managed patients)? - Yes 3. High Intensity Statin (Lipitor 40-80mg  or Crestor 20-40mg ) prescribed? -  Yes 4. For EF <40%, was ACEI/ARB prescribed? - Not Applicable (EF >/= 06%) 5. For EF <40%, Aldosterone Antagonist (Spironolactone or Eplerenone) prescribed? - Not Applicable (EF >/= 30%) 6. Cardiac Rehab Phase II ordered? - Yes   _____________  Discharge Vitals Blood pressure 128/69, pulse 63, temperature 97.8 F (36.6 C), temperature source Oral, resp. rate 18, height 5\' 7"  (1.702 m), weight 73.2 kg, SpO2 97 %.  Filed Weights   03/28/20 0943 03/28/20 1823 03/29/20 0426  Weight: 74.8 kg 74 kg 73.2 kg   General: 79 y.o. male resting comfortably in no acute distress. HEENT: Normocephalic and atraumatic.  Neck: Supple.  Heart: RRR. Distinct S1 and S2. No murmurs, gallops, or rubs. Right radial cath site soft with no signs of hematoma. Lungs: No increased work of breathing. Clear to ausculation bilaterally. No wheezes, rhonchi, or rales.  Abdomen: Soft, non-distended, and non-tender to palpation.  Extremities: No lower extremity edema.    Skin: Warm and dry. Neuro: No focal deficits. Psych: Normal affect. Responds appropriately.   Labs & Radiologic Studies    CBC Recent Labs    03/27/20 0948 03/29/20 0543  WBC 8.3 9.7  NEUTROABS 5.6  --   HGB 14.6 14.4  HCT 44.7 43.7  MCV 92.7 91.8  PLT 194 160   Basic Metabolic Panel Recent Labs    03/29/20 0543  NA 140  K 3.7  CL 104  CO2 26  GLUCOSE 194*  BUN 10  CREATININE 0.87  CALCIUM 9.3   Liver Function Tests No results for input(s): AST, ALT, ALKPHOS, BILITOT, PROT, ALBUMIN in the last 72 hours. No results for input(s): LIPASE, AMYLASE in the last 72 hours. High Sensitivity Troponin:   No results for input(s): TROPONINIHS in the last 720 hours.  BNP Invalid input(s): POCBNP D-Dimer No results for input(s): DDIMER in the last 72 hours. Hemoglobin A1C No results for input(s): HGBA1C in the last 72 hours. Fasting Lipid Panel No results for input(s): CHOL, HDL, LDLCALC, TRIG, CHOLHDL, LDLDIRECT in the last 72  hours. Thyroid Function Tests No results for input(s): TSH, T4TOTAL, T3FREE, THYROIDAB in the last 72 hours.  Invalid input(s): FREET3 _____________  CARDIAC CATHETERIZATION  Addendum Date: 03/28/2020    Ost LAD to Prox LAD lesion is 30% stenosed.  Mid LAD lesion is 70% stenosed. Significant by DFR. Large plaque burden by IVUS.  A drug-eluting stent was successfully placed using a STENT RESOLUTE ONYX 3.5X26. Stent optimized with IVUS.  Post intervention, there is a 0% residual stenosis.  Dist LAD lesion is 30% stenosed.  The left ventricular systolic function is normal.  LV end diastolic pressure is normal.  The left ventricular ejection fraction is 55-65% by visual estimate.  There is no aortic valve stenosis.  Continue aggressive secondary prevention including smoking cessation.  Watch overnight.  Plan for discharge tomorrow.   Result Date: 03/28/2020  Ost LAD to Prox LAD lesion is 30% stenosed.  Mid LAD lesion is 70% stenosed. Significant by DFR. Large plaque burden by IVUS.  A drug-eluting stent was successfully placed using a STENT RESOLUTE ONYX 3.5X26. Stent optimized with IVUS.  Post intervention, there is a 0% residual stenosis.  Dist LAD lesion is 30% stenosed.  The left  ventricular systolic function is normal.  LV end diastolic pressure is normal.  The left ventricular ejection fraction is 55-65% by visual estimate.  There is no aortic valve stenosis.  Continue aggressive secondary prevention including smoking cessation.  Watch overnight.  Plan for discharge tomorrow.   CT CORONARY MORPH W/CTA COR W/SCORE W/CA W/CM &/OR WO/CM  Addendum Date: 03/17/2020   ADDENDUM REPORT: 03/17/2020 11:26 CLINICAL DATA:  Chest pain EXAM: Cardiac CTA MEDICATIONS: Sub lingual nitro. 4 mg TECHNIQUE: The patient was scanned on a [employer] 192 scanner. Gantry rotation speed was 250 msecs. Collimation was. 6 mm . A 120 kV prospective scan was triggered in the ascending thoracic aorta at 140 HU's  with full mA between 30-70% of the R-R interval . Average HR during the scan was 56 bpm. The 3D data set was interpreted on a dedicated work station using MPR, MIP and VRT modes. A total of 80 cc of contrast was used. FINDINGS: Non-cardiac: See separate report from [facility]. No significant findings on limited lung and soft tissue windows. Calcium score: Calcium noted in LAD and Circumflex Coronary Arteries: Left  dominant with no anomalies LM: Normal LAD: 50-69% % ostial, proximal and mid calcific plaque D1: Small vessel D2: Normal Circumflex: 1-24% calcified plaque in proximal and mid vessel OM1: Normal OM2: Normal PDA: Normal PLB: Normal RCA: Small non dominant IMPRESSION: 1. Calcium score 330 which is 67 th percentile for age and sex 2.  Normal aortic root diameter 3.7 cm 3.  CAD RADS 3 CAD possibly obstructive in LAD Study sent for FFR CT Jenkins Rouge Electronically Signed   By: Jenkins Rouge M.D.   On: 03/17/2020 11:26   Result Date: 03/17/2020 EXAM: OVER-READ INTERPRETATION  CT CHEST The following report is an over-read performed by radiologist Dr. Vinnie Langton of [facility], [place] on 03/17/2020. This over-read does not include interpretation of cardiac or coronary anatomy or pathology. The coronary calcium score/coronary CTA interpretation by the cardiologist is attached. COMPARISON:  None. FINDINGS: Within the visualized portions of the thorax there are no suspicious appearing pulmonary nodules or masses, there is no acute consolidative airspace disease, no pleural effusions, no pneumothorax and no lymphadenopathy. Visualized portions of the upper abdomen are unremarkable. There are no aggressive appearing lytic or blastic lesions noted in the visualized portions of the skeleton. IMPRESSION: No significant incidental noncardiac findings are noted. Electronically Signed: By: Vinnie Langton M.D. On: 03/17/2020 08:42   CT CORONARY FRACTIONAL FLOW RESERVE DATA PREP  Result Date:  03/18/2020 CLINICAL DATA:  CAD EXAM: FFR CT TECHNIQUE: The best systolic and diastolic phases of the patients gated cardiac CTA sent to heart flow for hemodynamic analysis FINDINGS: Normal FFR CT in RCA and circumflex FFR CT O.94 in proximal LAD FFR CT 0.87 in mid LAD FFR CT 0.79 in distal LAD and 0.71 in apical LAD IMPRESSION: FFR CT positive in distal LAD only Jenkins Rouge Electronically Signed   By: Jenkins Rouge M.D.   On: 03/18/2020 11:20   Disposition   Patient is being discharged home today in good condition.  Follow-up Plans & Appointments     Follow-up Information    Erma Heritage, PA-C Follow up.   Specialties: Physician Assistant, Cardiology Why: Follow-up scheduled for 04/21/2020 at 2:30pm. Please arrive 15 minutes early for check-in. Contact information: [place] [address] [phone]              Discharge Instructions    Amb Referral to  Cardiac Rehabilitation   Complete by: As directed    Diagnosis: Coronary Stents   After initial evaluation and assessments completed: Virtual Based Care may be provided alone or in conjunction with Phase 2 Cardiac Rehab based on patient barriers.: Yes   Diet - low sodium heart healthy   Complete by: As directed    Increase activity slowly   Complete by: As directed       Discharge Medications   Allergies as of 03/29/2020      Reactions   Morphine    Other reaction(s): Other (See Comments) hallucinations   Morphine And Related Other (See Comments)   hallucinations   Scopolamine    Other reaction(s): Other (See Comments) Body shuts down   Scopolamine Hbr Other (See Comments)   Body shuts down      Medication List    STOP taking these medications   simvastatin 40 MG tablet Commonly known as: ZOCOR     TAKE these medications   acetaminophen 650 MG CR tablet Commonly known as: TYLENOL Take 650-1,300 mg by mouth every 8 (eight) hours as needed for pain.   amLODipine 5 MG tablet Commonly  known as: NORVASC Take 1 tablet (5 mg total) by mouth daily.   aspirin EC 81 MG tablet Take 81 mg by mouth daily.   calcium carbonate 500 MG chewable tablet Commonly known as: TUMS - dosed in mg elemental calcium Chew 1-2 tablets by mouth 3 (three) times daily as needed for indigestion or heartburn.   clopidogrel 75 MG tablet Commonly known as: PLAVIX Take 1 tablet (75 mg total) by mouth daily with breakfast. Start taking on: March 30, 2020   enalapril 10 MG tablet Commonly known as: VASOTEC Take 20 mg by mouth in the morning and at bedtime.   meclizine 25 MG tablet Commonly known as: ANTIVERT Take 25 mg by mouth 2 (two) times daily as needed for dizziness.   metFORMIN 500 MG tablet Commonly known as: GLUCOPHAGE Take 500 mg by mouth daily.   multivitamin with minerals Tabs tablet Take 1 tablet by mouth daily. Centrum   nitroGLYCERIN 0.4 MG SL tablet Commonly known as: NITROSTAT Place 1 tablet (0.4 mg total) under the tongue every 5 (five) minutes as needed for chest pain. (Not to exceed 3 in a 15 minute time frame.  If no relief after second dose, call 911.   pantoprazole 40 MG tablet Commonly known as: PROTONIX Take 40 mg by mouth in the morning and at bedtime.   rosuvastatin 40 MG tablet Commonly known as: CRESTOR Take 1 tablet (40 mg total) by mouth daily.   triamcinolone cream 0.1 % Commonly known as: KENALOG Apply 1 application topically 2 (two) times daily as needed (skin irritation/rash).          Outstanding Labs/Studies   Repeat lipid panel and LFTs in 6-8 weeks.  Duration of Discharge Encounter   Greater than 30 minutes including physician time.  Signed, Darreld Mclean, PA-C 03/29/2020, 10:42 AM

## 2020-04-18 DIAGNOSIS — J449 Chronic obstructive pulmonary disease, unspecified: Secondary | ICD-10-CM | POA: Diagnosis not present

## 2020-04-18 DIAGNOSIS — E7800 Pure hypercholesterolemia, unspecified: Secondary | ICD-10-CM | POA: Diagnosis not present

## 2020-04-18 DIAGNOSIS — I10 Essential (primary) hypertension: Secondary | ICD-10-CM | POA: Diagnosis not present

## 2020-04-18 DIAGNOSIS — E119 Type 2 diabetes mellitus without complications: Secondary | ICD-10-CM | POA: Diagnosis not present

## 2020-04-19 ENCOUNTER — Ambulatory Visit: Payer: Medicare HMO | Admitting: Student

## 2020-04-21 ENCOUNTER — Encounter: Payer: Self-pay | Admitting: Student

## 2020-04-21 ENCOUNTER — Other Ambulatory Visit: Payer: Self-pay

## 2020-04-21 ENCOUNTER — Ambulatory Visit (INDEPENDENT_AMBULATORY_CARE_PROVIDER_SITE_OTHER): Payer: Medicare HMO | Admitting: Student

## 2020-04-21 VITALS — BP 140/78 | HR 60 | Ht 67.0 in | Wt 168.0 lb

## 2020-04-21 DIAGNOSIS — I2510 Atherosclerotic heart disease of native coronary artery without angina pectoris: Secondary | ICD-10-CM | POA: Diagnosis not present

## 2020-04-21 DIAGNOSIS — Z720 Tobacco use: Secondary | ICD-10-CM | POA: Diagnosis not present

## 2020-04-21 DIAGNOSIS — E785 Hyperlipidemia, unspecified: Secondary | ICD-10-CM

## 2020-04-21 DIAGNOSIS — I10 Essential (primary) hypertension: Secondary | ICD-10-CM

## 2020-04-21 MED ORDER — ROSUVASTATIN CALCIUM 40 MG PO TABS: 40.0000 mg | ORAL_TABLET | Freq: Every day | ORAL | 3 refills | Status: DC

## 2020-04-21 MED ORDER — CLOPIDOGREL BISULFATE 75 MG PO TABS: 75.0000 mg | ORAL_TABLET | Freq: Every day | ORAL | 3 refills | Status: DC

## 2020-04-21 NOTE — Patient Instructions (Signed)
Medication Instructions:  Your physician recommends that you continue on your current medications as directed. Please refer to the Current Medication list given to you today.  *If you need a refill on your cardiac medications before your next appointment, please call your pharmacy*   Lab Work: TO BE DONE IN 1 MONTH: FASTING LIPID PANEL, LFTS  If you have labs (blood work) drawn today and your tests are completely normal, you will receive your results only by: Marland Kitchen MyChart Message (if you have MyChart) OR . A paper copy in the mail If you have any lab test that is abnormal or we need to change your treatment, we will call you to review the results.   Testing/Procedures: NONE   Follow-Up: At [facility], you and your health needs are our priority.  As part of our continuing mission to provide you with exceptional heart care, we have created designated Provider Care Teams.  These Care Teams include your primary Cardiologist (physician) and Advanced Practice Providers (APPs -  Physician Assistants and Nurse Practitioners) who all work together to provide you with the care you need, when you need it.  We recommend signing up for the patient portal called "MyChart".  Sign up information is provided on this After Visit Summary.  MyChart is used to connect with patients for Virtual Visits (Telemedicine).  Patients are able to view lab/test results, encounter notes, upcoming appointments, etc.  Non-urgent messages can be sent to your provider as well.   To learn more about what you can do with MyChart, go to [email or web address].    Your next appointment:   6 month(s)  The format for your next appointment:   In Person  Provider:   You may see Jenkins Rouge, MD or one of the following Advanced Practice Providers on your designated Care Team:    [place], PA-C   Ermalinda Barrios, [place]

## 2020-04-21 NOTE — Progress Notes (Signed)
Cardiology Office Note    Date:  04/22/2020   ID:  Richard, Rocha 09-23-1940, MRN [id number]  PCP:  Monico Blitz, MD  Cardiologist: Jenkins Rouge, MD    Chief Complaint  Patient presents with  . Follow-up    s/p cardiac catheterization    History of Present Illness:    Richard Rocha is a 79 y.o. male with past medical history of CAD (s/p Coronary CT in 03/2020 showing LAD stenosis and cath recommended), HTN, HLD, Type 2 DM and tobacco use who presents to the office today for follow-up from his recent cardiac catheterization.   He was last examined by myself on 03/24/2020 for follow-up from his recent Coronary CT which demonstrated possible obstructive disease along the LAD. He reported still having intermittent episodes of chest discomfort which would typically occur at night. Cardiac catheterization was previously recommended by Dr. Johnsie Cancel and this was scheduled for 03/28/2020. His catheterization showed 70% mid LAD stenosis which was significant by FFR and treated with DES placement. He did have residual 30% ostial LAD stenosis and 30% distal LAD stenosis with no significant plaque along the LCx or RCA. He was started on ASA and Plavix and his PTA Simvastatin was switched to Crestor 40 mg daily. The following morning, he denied any recurrent chest pain and was discharged home. 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  In talking with the patient today, he reports overall doing well from a cardiac perspective since his last visit.  He denies any recent exertional chest pain or dyspnea on exertion. He does report episodes of pain underneath his left armpit which occurs intermittently and spontaneously resolves. He denies any recent orthopnea, PND or lower extremity edema. No reported complications following his recent cardiac catheterization.  Past Medical History:  Diagnosis Date  . CAD (coronary artery disease)    a.  s/p DES to mid-LAD in 03/2020  . Diabetes mellitus type II   . Dyslipidemia   . Essential hypertension   . Gastritis and duodenitis    EGD 03/2012  . Hiatal hernia   . Schatzki's ring   . Tobacco abuse     Past Surgical History:  Procedure Laterality Date  . CARDIAC CATHETERIZATION    . CHOLECYSTECTOMY    . COLONOSCOPY    . COLONOSCOPY N/A 09/18/2018   Procedure: COLONOSCOPY;  Surgeon: [name], MD;  [facility];  Service: Endoscopy;  Laterality: N/A;  8:30am  . CORONARY STENT INTERVENTION N/A 03/28/2020   Procedure: CORONARY STENT INTERVENTION;  Surgeon: [name], MD;  Location: [facility];  Service: Cardiovascular;  Laterality: N/A;  . ESOPHAGOGASTRODUODENOSCOPY  03/25/2012   Procedure: ESOPHAGOGASTRODUODENOSCOPY (EGD);  Surgeon: [name], MD;  Location: [facility];  Service: Gastroenterology;  Laterality: N/A;  . INGUINAL HERNIA REPAIR     bilateral  . INTRAVASCULAR PRESSURE WIRE/FFR STUDY N/A 03/28/2020   Procedure: INTRAVASCULAR PRESSURE WIRE/FFR STUDY;  Surgeon: [name], MD;  Location: [facility];  Service: Cardiovascular;  Laterality: N/A;  . JOINT REPLACEMENT     ltknee arthroscopic,rt shoulder arthroscopic  . KNEE SURGERY     left arthroscopy  . LEFT HEART CATH AND CORONARY ANGIOGRAPHY N/A 03/28/2020   Procedure: LEFT HEART CATH AND CORONARY ANGIOGRAPHY;  Surgeon: [name], MD;  Location: [facility];  Service: Cardiovascular;  Laterality: N/A;  . LEFT HEART CATHETERIZATION  WITH CORONARY ANGIOGRAM N/A 03/23/2012   Procedure: LEFT HEART CATHETERIZATION WITH CORONARY ANGIOGRAM;  Surgeon: [name], MD;  Location: [facility];  Service: Cardiovascular;  Laterality: N/A;  . POLYPECTOMY  09/18/2018   Procedure: POLYPECTOMY;  Surgeon: [name], MD;  [facility];  Service: Endoscopy;;  hepatic flexure (CSx2), cecal (CSx1), splenic flexure(CSx2)  . SHOULDER SURGERY     right     Current Medications: Outpatient Medications Prior to Visit  Medication Sig Dispense Refill  . acetaminophen (TYLENOL) 650 MG CR tablet Take 650-1,300 mg by mouth every 8 (eight) hours as needed for pain.    Marland Kitchen amLODipine (NORVASC) 5 MG tablet Take 1 tablet (5 mg total) by mouth daily.    Marland Kitchen aspirin EC 81 MG tablet Take 81 mg by mouth daily.    . calcium carbonate (TUMS - DOSED IN MG ELEMENTAL CALCIUM) 500 MG chewable tablet Chew 1-2 tablets by mouth 3 (three) times daily as needed for indigestion or heartburn.    . enalapril (VASOTEC) 10 MG tablet Take 20 mg by mouth in the morning and at bedtime.     . meclizine (ANTIVERT) 25 MG tablet Take 25 mg by mouth 2 (two) times daily as needed for dizziness.     . metFORMIN (GLUCOPHAGE) 500 MG tablet Take 500 mg by mouth daily.   5  . Multiple Vitamin (MULTIVITAMIN WITH MINERALS) TABS Take 1 tablet by mouth daily. Centrum    . nitroGLYCERIN (NITROSTAT) 0.4 MG SL tablet Place 1 tablet (0.4 mg total) under the tongue every 5 (five) minutes as needed for chest pain. (Not to exceed 3 in a 15 minute time frame.  If no relief after second dose, call 911. 25 tablet 3  . pantoprazole (PROTONIX) 40 MG tablet Take 40 mg by mouth in the morning and at bedtime.    . triamcinolone cream (KENALOG) 0.1 % Apply 1 application topically 2 (two) times daily as needed (skin irritation/rash).     . clopidogrel (PLAVIX) 75 MG tablet Take 1 tablet (75 mg total) by mouth daily with breakfast. 30 tablet 11  . rosuvastatin (CRESTOR) 40 MG tablet Take 1 tablet (40 mg total) by mouth daily. 30 tablet 3   No facility-administered medications prior to visit.     Allergies:   Morphine, Morphine and related, Scopolamine, and Scopolamine hbr   Social History   Socioeconomic History  . Marital status: Widowed    Spouse name: Not on file  . Number of children: 3  . Years of education: Not on file  . Highest education level: Not on file  Occupational History  . Occupation:  Truck driving (part time)    Employer: [employer]  Tobacco Use  . Smoking status: Current Every Day Smoker    Packs/day: 1.00    Years: 60.00    Pack years: 60.00    Types: Cigarettes    Start date: 08/23/1959  . Smokeless tobacco: Never Used  . Tobacco comment: He has quit many times.  Vaping Use  . Vaping Use: Never used  Substance and Sexual Activity  . Alcohol use: Yes    Alcohol/week: 1.0 standard drink    Types: 1 Shots of liquor per week    Comment: once in a while  . Drug use: No  . Sexual activity: Yes    Birth control/protection: None  Other Topics Concern  . Not on file  Social History Narrative   Lives with wife.  Social Determinants of Health   Financial Resource Strain:   . Difficulty of Paying Living Expenses:   Food Insecurity:   . Worried About Charity fundraiser in the Last Year:   . Arboriculturist in the Last Year:   Transportation Needs:   . Film/video editor (Medical):   Marland Kitchen Lack of Transportation (Non-Medical):   Physical Activity:   . Days of Exercise per Week:   . Minutes of Exercise per Session:   Stress:   . Feeling of Stress :   Social Connections:   . Frequency of Communication with Friends and Family:   . Frequency of Social Gatherings with Friends and Family:   . Attends Religious Services:   . Active Member of Clubs or Organizations:   . Attends Archivist Meetings:   Marland Kitchen Marital Status:      Family History:  The patient's family history includes Cancer in his father; Coronary artery disease (age of onset: 32) in his brother.   Review of Systems:   Please see the history of present illness.     General:  No chills, fever, night sweats or weight changes.  Cardiovascular:  No chest pain, dyspnea on exertion, edema, orthopnea, palpitations, paroxysmal nocturnal dyspnea. Dermatological: No rash, lesions/masses. Positive for easy bruising.  Respiratory: No cough, dyspnea Urologic: No hematuria, dysuria Abdominal:    No nausea, vomiting, diarrhea, bright red blood per rectum, melena, or hematemesis Neurologic:  No visual changes, wkns, changes in mental status. All other systems reviewed and are otherwise negative except as noted above.   Physical Exam:    VS:  BP 140/78   Pulse 60   Ht 5\' 7"  (1.702 m)   Wt 168 lb (76.2 kg)   BMI 26.31 kg/m    General: Well developed, well nourished,male appearing in no acute distress. Head: Normocephalic, atraumatic. Neck: No carotid bruits. JVD not elevated.  Lungs: Respirations regular and unlabored, without wheezes or rales.  Heart: Regular rate and rhythm. No S3 or S4.  No murmur, no rubs, or gallops appreciated. Abdomen: Appears non-distended. No obvious abdominal masses. Msk:  Strength and tone appear normal for age. No obvious joint deformities or effusions. Extremities: No clubbing or cyanosis. No lower extremity edema.  Distal pedal pulses are 2+ bilaterally. Radial site without ecchymosis or evidence of a hematoma.  Neuro: Alert and oriented X 3. Moves all extremities spontaneously. No focal deficits noted. Psych:  Responds to questions appropriately with a normal affect. Skin: No rashes or lesions noted  Wt Readings from Last 3 Encounters:  04/21/20 168 lb (76.2 kg)  03/29/20 161 lb 4.8 oz (73.2 kg)  03/24/20 165 lb 9.6 oz (75.1 kg)     Studies/Labs Reviewed:   EKG:  EKG is ordered today. The ekg ordered today demonstrates NSR, HR 60 with no acute ST changes when compared to prior tracings.   Recent Labs: 03/29/2020: BUN 10; Creatinine, Ser 0.87; Hemoglobin 14.4; Platelets 192; Potassium 3.7; Sodium 140   Lipid Panel    Component Value Date/Time   CHOL 148 03/24/2012 0515   TRIG 237 (H) 03/24/2012 0515   HDL 31 (L) 03/24/2012 0515   CHOLHDL 4.8 03/24/2012 0515   VLDL 47 (H) 03/24/2012 0515   LDLCALC 70 03/24/2012 0515    Additional studies/ records that were reviewed today include:   Cardiac Catheterization: 03/2020  Ost LAD to Prox  LAD lesion is 30% stenosed.  Mid LAD lesion is 70% stenosed. Significant by DFR. Large  plaque burden by IVUS.  A drug-eluting stent was successfully placed using a STENT RESOLUTE ONYX 3.5X26. Stent optimized with IVUS.  Post intervention, there is a 0% residual stenosis.  Dist LAD lesion is 30% stenosed.  The left ventricular systolic function is normal.  LV end diastolic pressure is normal.  The left ventricular ejection fraction is 55-65% by visual estimate.  There is no aortic valve stenosis.   Continue aggressive secondary prevention including smoking cessation.  Watch overnight.  Plan for discharge tomorrow.   Assessment:    1. Coronary artery disease involving native coronary artery of native heart without angina pectoris   2. Essential hypertension   3. Hyperlipidemia LDL goal <70   4. Tobacco use      Plan:   In order of problems listed above:  1. CAD - Recent cardiac catheterization showed 70% mid LAD stenosis which was significant by FFR and treated with DES placement. He did have residual 30% ostial LAD stenosis and 30% distal LAD stenosis with no significant plaque along the LCx or RCA.  - He denies any recent chest pain or dyspnea on exertion.  - Continue ASA 81mg  daily, Plavix 75mg  daily and Crestor 40mg  daily.   2. HTN - BP is slightly elevated at 140/78 during today's visit. Will continue to follow and can titrate Enalapril if BP remains above goal. Remains on Amlodipine 5mg  daily and Enalapril 20mg  daily.   3. HLD - Recently switched from Simvastatin to Crestor 40mg  daily. Will recheck FLP and LFT's within the next month for reassessment. Goal LDL is less than 70 with known CAD.   4. Tobacco Use - He continues to smoke approximately 1 ppd. Cessation advised.    Medication Adjustments/Labs and Tests Ordered: Current medicines are reviewed at length with the patient today.  Concerns regarding medicines are outlined above.  Medication changes, Labs and  Tests ordered today are listed in the Patient Instructions below. Patient Instructions  Medication Instructions:  Your physician recommends that you continue on your current medications as directed. Please refer to the Current Medication list given to you today.  *If you need a refill on your cardiac medications before your next appointment, please call your pharmacy*   Lab Work: TO BE DONE IN 1 MONTH: FASTING LIPID PANEL, LFTS  If you have labs (blood work) drawn today and your tests are completely normal, you will receive your results only by: Marland Kitchen MyChart Message (if you have MyChart) OR . A paper copy in the mail If you have any lab test that is abnormal or we need to change your treatment, we will call you to review the results.   Testing/Procedures: NONE   Follow-Up: At [facility], you and your health needs are our priority.  As part of our continuing mission to provide you with exceptional heart care, we have created designated Provider Care Teams.  These Care Teams include your primary Cardiologist (physician) and Advanced Practice Providers (APPs -  Physician Assistants and Nurse Practitioners) who all work together to provide you with the care you need, when you need it.  We recommend signing up for the patient portal called "MyChart".  Sign up information is provided on this After Visit Summary.  MyChart is used to connect with patients for Virtual Visits (Telemedicine).  Patients are able to view lab/test results, encounter notes, upcoming appointments, etc.  Non-urgent messages can be sent to your provider as well.   To learn more about what you can do with MyChart,  go to [email or web address].    Your next appointment:   6 month(s)  The format for your next appointment:   In Person  Provider:   You may see Jenkins Rouge, MD or one of the following Advanced Practice Providers on your designated Care Team:    Bernerd Pho, PA-C   Ermalinda Barrios, PA-C      Signed, Erma Heritage, [place]  04/22/2020 9:22 AM    [facility] [address] [place], [place] [address] Phone: [phone] Fax: [phone]

## 2020-04-22 ENCOUNTER — Encounter: Payer: Self-pay | Admitting: Student

## 2020-05-29 ENCOUNTER — Telehealth: Payer: Self-pay | Admitting: Student

## 2020-05-29 DIAGNOSIS — Z955 Presence of coronary angioplasty implant and graft: Secondary | ICD-10-CM

## 2020-05-29 DIAGNOSIS — I2510 Atherosclerotic heart disease of native coronary artery without angina pectoris: Secondary | ICD-10-CM

## 2020-05-29 NOTE — Telephone Encounter (Signed)
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  He needs to clarify this or get documentation stating otherwise.

## 2020-05-29 NOTE — Telephone Encounter (Signed)
Advised that we need more clarification on why he was denied. Advised to bring letter of denial to office. Verbalized understanding.

## 2020-05-31 ENCOUNTER — Encounter: Payer: Self-pay | Admitting: *Deleted

## 2020-05-31 NOTE — Addendum Note (Signed)
Addended by: Merlene Laughter on: 05/31/2020 12:32 PM   Modules accepted: Orders

## 2020-05-31 NOTE — Telephone Encounter (Signed)
     He was cleared to return to work one week after his procedure in 03/2020 (reference Discharge Summary from 03/29/2020). He denied any anginal symptoms at the time of his office visit in 04/2020. In reviewing the note provided from his workplace, it was recommended he have an Exercise Tolerance Test 3-6 months post-PCI and then a repeat ETT at least every other year. 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 Was requested to fax letter to [place] [phone]). Can arrange for an ETT prior to 09/2020 since this would be the 69-month mark from his procedure.   Signed, Erma Heritage, PA-C 05/31/2020, 8:06 AM Pager: [phone]

## 2020-05-31 NOTE — Telephone Encounter (Addendum)
Contacted to give ETT instructions and patient says he can not walk on a treadmill. Advised that he would be contacted back with new test and instructions. Verbalized understanding.   Note- per [place], the stress test can be done any time after 06/28/20 - 09/28/2020--patient is requesting to have it done in November 2021.

## 2020-05-31 NOTE — Addendum Note (Signed)
Addended by: Merlene Laughter on: 05/31/2020 01:07 PM   Modules accepted: Orders

## 2020-05-31 NOTE — Telephone Encounter (Signed)
Attempted to reach patient by phone to read stress test instruction but no answer and unable to leave a message. Lexiscan instructions will be mailed to patient's home address.

## 2020-05-31 NOTE — Telephone Encounter (Signed)
Patient informed and verbalized understanding of plan. Letter faxed to [place] [phone])

## 2020-06-01 DIAGNOSIS — E1165 Type 2 diabetes mellitus with hyperglycemia: Secondary | ICD-10-CM | POA: Diagnosis not present

## 2020-06-01 DIAGNOSIS — J449 Chronic obstructive pulmonary disease, unspecified: Secondary | ICD-10-CM | POA: Diagnosis not present

## 2020-06-01 DIAGNOSIS — Z6825 Body mass index (BMI) 25.0-25.9, adult: Secondary | ICD-10-CM | POA: Diagnosis not present

## 2020-06-01 DIAGNOSIS — I10 Essential (primary) hypertension: Secondary | ICD-10-CM | POA: Diagnosis not present

## 2020-06-01 DIAGNOSIS — Z299 Encounter for prophylactic measures, unspecified: Secondary | ICD-10-CM | POA: Diagnosis not present

## 2020-06-01 DIAGNOSIS — E119 Type 2 diabetes mellitus without complications: Secondary | ICD-10-CM | POA: Diagnosis not present

## 2020-06-08 DIAGNOSIS — E119 Type 2 diabetes mellitus without complications: Secondary | ICD-10-CM | POA: Diagnosis not present

## 2020-06-08 DIAGNOSIS — J449 Chronic obstructive pulmonary disease, unspecified: Secondary | ICD-10-CM | POA: Diagnosis not present

## 2020-06-08 DIAGNOSIS — E7800 Pure hypercholesterolemia, unspecified: Secondary | ICD-10-CM | POA: Diagnosis not present

## 2020-06-08 DIAGNOSIS — I10 Essential (primary) hypertension: Secondary | ICD-10-CM | POA: Diagnosis not present

## 2020-06-26 DIAGNOSIS — M47819 Spondylosis without myelopathy or radiculopathy, site unspecified: Secondary | ICD-10-CM | POA: Diagnosis not present

## 2020-06-26 DIAGNOSIS — Z299 Encounter for prophylactic measures, unspecified: Secondary | ICD-10-CM | POA: Diagnosis not present

## 2020-06-26 DIAGNOSIS — Z23 Encounter for immunization: Secondary | ICD-10-CM | POA: Diagnosis not present

## 2020-06-26 DIAGNOSIS — M549 Dorsalgia, unspecified: Secondary | ICD-10-CM | POA: Diagnosis not present

## 2020-06-26 DIAGNOSIS — E7800 Pure hypercholesterolemia, unspecified: Secondary | ICD-10-CM | POA: Diagnosis not present

## 2020-06-26 DIAGNOSIS — Z6826 Body mass index (BMI) 26.0-26.9, adult: Secondary | ICD-10-CM | POA: Diagnosis not present

## 2020-07-07 DIAGNOSIS — I10 Essential (primary) hypertension: Secondary | ICD-10-CM | POA: Diagnosis not present

## 2020-07-07 DIAGNOSIS — E119 Type 2 diabetes mellitus without complications: Secondary | ICD-10-CM | POA: Diagnosis not present

## 2020-07-07 DIAGNOSIS — E7800 Pure hypercholesterolemia, unspecified: Secondary | ICD-10-CM | POA: Diagnosis not present

## 2020-07-07 DIAGNOSIS — J449 Chronic obstructive pulmonary disease, unspecified: Secondary | ICD-10-CM | POA: Diagnosis not present

## 2020-07-21 ENCOUNTER — Encounter (HOSPITAL_COMMUNITY): Payer: Self-pay

## 2020-07-21 ENCOUNTER — Other Ambulatory Visit: Payer: Self-pay

## 2020-07-21 ENCOUNTER — Encounter (HOSPITAL_COMMUNITY)
Admission: RE | Admit: 2020-07-21 | Discharge: 2020-07-21 | Disposition: A | Discharge status: Home / Self Care | Payer: Medicare HMO | Source: Ambulatory Visit | Attending: Student | Admitting: Student

## 2020-07-21 ENCOUNTER — Encounter (HOSPITAL_BASED_OUTPATIENT_CLINIC_OR_DEPARTMENT_OTHER)
Admission: RE | Admit: 2020-07-21 | Discharge: 2020-07-21 | Disposition: A | Discharge status: Home / Self Care | Payer: Medicare HMO | Source: Ambulatory Visit | Attending: Student | Admitting: Student

## 2020-07-21 DIAGNOSIS — Z955 Presence of coronary angioplasty implant and graft: Secondary | ICD-10-CM

## 2020-07-21 DIAGNOSIS — I2510 Atherosclerotic heart disease of native coronary artery without angina pectoris: Secondary | ICD-10-CM

## 2020-07-21 LAB — NM MYOCAR MULTI W/SPECT W/WALL MOTION / EF
LV dias vol: 100 mL (ref 62–150)
LV sys vol: 36 mL
Peak HR: 83 {beats}/min
RATE: 0.36
Rest HR: 50 {beats}/min
SDS: 2
SRS: 2
SSS: 4
TID: 1.19

## 2020-07-21 MED ORDER — TECHNETIUM TC 99M TETROFOSMIN IV KIT
30.0000 | PACK | Freq: Once | INTRAVENOUS | Status: AC | PRN
  Administered 2020-07-21: 29.6 via INTRAVENOUS

## 2020-07-21 MED ORDER — TECHNETIUM TC 99M TETROFOSMIN IV KIT
10.0000 | PACK | Freq: Once | INTRAVENOUS | Status: AC | PRN
  Administered 2020-07-21: 9.5 via INTRAVENOUS

## 2020-07-21 MED ORDER — REGADENOSON 0.4 MG/5ML IV SOLN
INTRAVENOUS | Status: AC
  Administered 2020-07-21: 0.4 mg via INTRAVENOUS
  Filled 2020-07-21: qty 5

## 2020-07-21 MED ORDER — SODIUM CHLORIDE FLUSH 0.9 % IV SOLN
INTRAVENOUS | Status: AC
  Administered 2020-07-21: 10 mL via INTRAVENOUS
  Filled 2020-07-21: qty 10

## 2020-07-31 DIAGNOSIS — E1165 Type 2 diabetes mellitus with hyperglycemia: Secondary | ICD-10-CM | POA: Diagnosis not present

## 2020-07-31 DIAGNOSIS — Z299 Encounter for prophylactic measures, unspecified: Secondary | ICD-10-CM | POA: Diagnosis not present

## 2020-07-31 DIAGNOSIS — D692 Other nonthrombocytopenic purpura: Secondary | ICD-10-CM | POA: Diagnosis not present

## 2020-07-31 DIAGNOSIS — J449 Chronic obstructive pulmonary disease, unspecified: Secondary | ICD-10-CM | POA: Diagnosis not present

## 2020-07-31 DIAGNOSIS — I10 Essential (primary) hypertension: Secondary | ICD-10-CM | POA: Diagnosis not present

## 2020-07-31 DIAGNOSIS — F17210 Nicotine dependence, cigarettes, uncomplicated: Secondary | ICD-10-CM | POA: Diagnosis not present

## 2020-08-08 DIAGNOSIS — E119 Type 2 diabetes mellitus without complications: Secondary | ICD-10-CM | POA: Diagnosis not present

## 2020-08-08 DIAGNOSIS — J449 Chronic obstructive pulmonary disease, unspecified: Secondary | ICD-10-CM | POA: Diagnosis not present

## 2020-08-08 DIAGNOSIS — E7800 Pure hypercholesterolemia, unspecified: Secondary | ICD-10-CM | POA: Diagnosis not present

## 2020-08-08 DIAGNOSIS — I10 Essential (primary) hypertension: Secondary | ICD-10-CM | POA: Diagnosis not present

## 2020-08-29 DIAGNOSIS — F17210 Nicotine dependence, cigarettes, uncomplicated: Secondary | ICD-10-CM | POA: Diagnosis not present

## 2020-08-29 DIAGNOSIS — K219 Gastro-esophageal reflux disease without esophagitis: Secondary | ICD-10-CM | POA: Diagnosis not present

## 2020-08-29 DIAGNOSIS — Z299 Encounter for prophylactic measures, unspecified: Secondary | ICD-10-CM | POA: Diagnosis not present

## 2020-08-29 DIAGNOSIS — R609 Edema, unspecified: Secondary | ICD-10-CM | POA: Diagnosis not present

## 2020-08-29 DIAGNOSIS — I25119 Atherosclerotic heart disease of native coronary artery with unspecified angina pectoris: Secondary | ICD-10-CM | POA: Diagnosis not present

## 2020-08-29 DIAGNOSIS — Z6826 Body mass index (BMI) 26.0-26.9, adult: Secondary | ICD-10-CM | POA: Diagnosis not present

## 2020-09-04 DIAGNOSIS — D692 Other nonthrombocytopenic purpura: Secondary | ICD-10-CM | POA: Diagnosis not present

## 2020-09-04 DIAGNOSIS — E1165 Type 2 diabetes mellitus with hyperglycemia: Secondary | ICD-10-CM | POA: Diagnosis not present

## 2020-09-04 DIAGNOSIS — F17210 Nicotine dependence, cigarettes, uncomplicated: Secondary | ICD-10-CM | POA: Diagnosis not present

## 2020-09-04 DIAGNOSIS — Z299 Encounter for prophylactic measures, unspecified: Secondary | ICD-10-CM | POA: Diagnosis not present

## 2020-09-04 DIAGNOSIS — I10 Essential (primary) hypertension: Secondary | ICD-10-CM | POA: Diagnosis not present

## 2020-09-04 DIAGNOSIS — I25119 Atherosclerotic heart disease of native coronary artery with unspecified angina pectoris: Secondary | ICD-10-CM | POA: Diagnosis not present

## 2020-10-16 NOTE — Progress Notes (Signed)
Cardiology Office Note    Date:  10/24/2020   ID:  Richard, Rocha 1941-08-06, MRN [id number]  PCP:  Monico Blitz, MD  Cardiologist: Jenkins Rouge, MD    No chief complaint on file.   History of Present Illness:    Richard Rocha is a 80 y.o. male with past medical history of CAD (s/p Coronary CT in 03/2020 showing LAD stenosis and cath recommended), HTN, HLD, Type 2 DM and tobacco use  03/28/2020. catheterization showed 70% mid LAD stenosis which was significant by FFR and treated with DES placement. He did have residual 30% ostial LAD stenosis and 30% distal LAD stenosis with no significant plaque along the LCx or RCA. He was started on ASA and Plavix and his PTA Simvastatin was switched to Crestor 40 mg daily. .  F/U myovue 07/21/20 was non ischemic with EF 63%   He seems nervous about his heart Was seen earlier this week in [facility] ER for atypical pain that occurred At rest and woke him from sleep R/O no ECG changes normal CXR  Past Medical History:  Diagnosis Date  . CAD (coronary artery disease)    a. s/p DES to mid-LAD in 03/2020  . Diabetes mellitus type II   . Dyslipidemia   . Essential hypertension   . Gastritis and duodenitis    EGD 03/2012  . Hiatal hernia   . Schatzki's ring   . Tobacco abuse     Past Surgical History:  Procedure Laterality Date  . CARDIAC CATHETERIZATION    . CHOLECYSTECTOMY    . COLONOSCOPY    . COLONOSCOPY N/A 09/18/2018   Procedure: COLONOSCOPY;  Surgeon: [name], MD;  [facility];  Service: Endoscopy;  Laterality: N/A;  8:30am  . CORONARY STENT INTERVENTION N/A 03/28/2020   Procedure: CORONARY STENT INTERVENTION;  Surgeon: [name], MD;  Location: [place] LAB;  Service: Cardiovascular;  Laterality: N/A;  . ESOPHAGOGASTRODUODENOSCOPY  03/25/2012   Procedure: ESOPHAGOGASTRODUODENOSCOPY (EGD);  Surgeon: [name], MD;  Location: [facility];  Service: Gastroenterology;  Laterality: N/A;  .  INGUINAL HERNIA REPAIR     bilateral  . INTRAVASCULAR PRESSURE WIRE/FFR STUDY N/A 03/28/2020   Procedure: INTRAVASCULAR PRESSURE WIRE/FFR STUDY;  Surgeon: [name], MD;  Location: [place] [facility] LAB;  Service: Cardiovascular;  Laterality: N/A;  . JOINT REPLACEMENT     ltknee arthroscopic,rt shoulder arthroscopic  . KNEE SURGERY     left arthroscopy  . LEFT HEART CATH AND CORONARY ANGIOGRAPHY N/A 03/28/2020   Procedure: LEFT HEART CATH AND CORONARY ANGIOGRAPHY;  Surgeon: [name], MD;  Location: [place] CV LAB;  Service: Cardiovascular;  Laterality: N/A;  . LEFT HEART CATHETERIZATION WITH CORONARY ANGIOGRAM N/A 03/23/2012   Procedure: LEFT HEART CATHETERIZATION WITH CORONARY ANGIOGRAM;  Surgeon: [name], MD;  Location: [facility] CATH LAB;  Service: Cardiovascular;  Laterality: N/A;  . POLYPECTOMY  09/18/2018   Procedure: POLYPECTOMY;  Surgeon: [name], MD;  [facility];  Service: Endoscopy;;  hepatic flexure (CSx2), cecal (CSx1), splenic flexure(CSx2)  . SHOULDER SURGERY     right    Current Medications: Outpatient Medications Prior to Visit  Medication Sig Dispense Refill  . acetaminophen (TYLENOL) 650 MG CR tablet Take 650-1,300 mg by mouth every 8 (eight) hours as needed for pain.    Marland Kitchen amLODipine (NORVASC) 5 MG tablet Take 1 tablet (5 mg total) by mouth daily.    Marland Kitchen aspirin EC 81  MG tablet Take 81 mg by mouth daily.    . calcium carbonate (TUMS - DOSED IN MG ELEMENTAL CALCIUM) 500 MG chewable tablet Chew 1-2 tablets by mouth 3 (three) times daily as needed for indigestion or heartburn.    . clopidogrel (PLAVIX) 75 MG tablet Take 1 tablet (75 mg total) by mouth daily with breakfast. 90 tablet 3  . enalapril (VASOTEC) 10 MG tablet Take 20 mg by mouth in the morning and at bedtime.     . meclizine (ANTIVERT) 25 MG tablet Take 25 mg by mouth 2 (two) times daily as needed for dizziness.     . metFORMIN (GLUCOPHAGE) 500 MG tablet Take 500 mg by mouth  daily.   5  . metoprolol tartrate (LOPRESSOR) 25 MG tablet Take 25 mg by mouth daily.    . Multiple Vitamin (MULTIVITAMIN WITH MINERALS) TABS Take 1 tablet by mouth daily. Centrum    . nitroGLYCERIN (NITROSTAT) 0.4 MG SL tablet Place 1 tablet (0.4 mg total) under the tongue every 5 (five) minutes as needed for chest pain. (Not to exceed 3 in a 15 minute time frame.  If no relief after second dose, call 911. 25 tablet 3  . pantoprazole (PROTONIX) 40 MG tablet Take 40 mg by mouth in the morning and at bedtime.    . rosuvastatin (CRESTOR) 40 MG tablet Take 1 tablet (40 mg total) by mouth daily. 90 tablet 3  . triamcinolone cream (KENALOG) 0.1 % Apply 1 application topically 2 (two) times daily as needed (skin irritation/rash).      No facility-administered medications prior to visit.     Allergies:   Morphine, Morphine and related, Scopolamine, and Scopolamine hbr   Social History   Socioeconomic History  . Marital status: Widowed    Spouse name: Not on file  . Number of children: 3  . Years of education: Not on file  . Highest education level: Not on file  Occupational History  . Occupation: Truck driving (part time)    Employer: [employer]  Tobacco Use  . Smoking status: Current Every Day Smoker    Packs/day: 1.00    Years: 60.00    Pack years: 60.00    Types: Cigarettes    Start date: 08/23/1959  . Smokeless tobacco: Never Used  . Tobacco comment: He has quit many times.  Vaping Use  . Vaping Use: Never used  Substance and Sexual Activity  . Alcohol use: Yes    Alcohol/week: 1.0 standard drink    Types: 1 Shots of liquor per week    Comment: once in a while  . Drug use: No  . Sexual activity: Yes    Birth control/protection: None  Other Topics Concern  . Not on file  Social History Narrative   Lives with wife.     Social Determinants of Health   Financial Resource Strain: Not on file  Food Insecurity: Not on file  Transportation Needs: Not on file  Physical  Activity: Not on file  Stress: Not on file  Social Connections: Not on file     Family History:  The patient's family history includes Cancer in his father; Coronary artery disease (age of onset: 64) in his brother.   Review of Systems:   Please see the history of present illness.     General:  No chills, fever, night sweats or weight changes.  Cardiovascular:  No chest pain, dyspnea on exertion, edema, orthopnea, palpitations, paroxysmal nocturnal dyspnea. Dermatological: No rash, lesions/masses. Positive for  easy bruising.  Respiratory: No cough, dyspnea Urologic: No hematuria, dysuria Abdominal:   No nausea, vomiting, diarrhea, bright red blood per rectum, melena, or hematemesis Neurologic:  No visual changes, wkns, changes in mental status. All other systems reviewed and are otherwise negative except as noted above.   Physical Exam:    VS:  BP 134/70   Pulse 69   Ht 5\' 7"  (1.702 m)   Wt 77.1 kg   SpO2 97%   BMI 26.63 kg/m    Affect appropriate Healthy:  appears stated age 72: normal Neck supple with no adenopathy JVP normal no bruits no thyromegaly Lungs clear with no wheezing and good diaphragmatic motion Heart:  S1/S2 no murmur, no rub, gallop or click PMI normal Abdomen: benighn, BS positve, no tenderness, no AAA no bruit.  No HSM or HJR Distal pulses intact with no bruits No edema Neuro non-focal Skin warm and dry No muscular weakness     Wt Readings from Last 3 Encounters:  10/24/20 77.1 kg  04/21/20 76.2 kg  03/29/20 73.2 kg     Studies/Labs Reviewed:   EKG:  EKG is ordered today. The ekg ordered today demonstrates NSR, HR 60 with no acute ST changes when compared to prior tracings.   Recent Labs: 03/29/2020: BUN 10; Creatinine, Ser 0.87; Hemoglobin 14.4; Platelets 192; Potassium 3.7; Sodium 140 10/17/2020: ALT 20   Lipid Panel    Component Value Date/Time   CHOL 82 10/17/2020 0824   TRIG 139 10/17/2020 0824   HDL 38 (L) 10/17/2020 0824    CHOLHDL 2.2 10/17/2020 0824   VLDL 28 10/17/2020 0824   LDLCALC 16 10/17/2020 0824    Additional studies/ records that were reviewed today include:   Cardiac Catheterization: 03/2020  Ost LAD to Prox LAD lesion is 30% stenosed.  Mid LAD lesion is 70% stenosed. Significant by DFR. Large plaque burden by IVUS.  A drug-eluting stent was successfully placed using a STENT RESOLUTE ONYX 3.5X26. Stent optimized with IVUS.  Post intervention, there is a 0% residual stenosis.  Dist LAD lesion is 30% stenosed.  The left ventricular systolic function is normal.  LV end diastolic pressure is normal.  The left ventricular ejection fraction is 55-65% by visual estimate.  There is no aortic valve stenosis.    Plan:   In order of problems listed above:  1. CAD - Cardiac catheterization 03/28/20  showed 70% mid LAD stenosis which was significant by FFR and treated with DES placement. He did have residual 30% ostial LAD stenosis and 30% distal LAD stenosis with no significant plaque along the LCx or RCA.  - Myovue non ischemic 07/21/20  - continue medical Rx  - has new bottle of nitro   2. HTN - Continue norvasc and enalapril stable   3. HLD - Goal LDL is less than 70 with known CAD.  -Needs f/u labs on crestor now   4. Tobacco Use - He continues to smoke approximately 1 ppd. Cessation advised.   Lipid / Liver  F/U in July consider d/c plavix at that time     Signed, Jenkins Rouge, MD  10/24/2020 12:49 PM    [facility]. [address] [place], [place] [address] Phone: [phone] Fax: [phone]

## 2020-10-17 ENCOUNTER — Other Ambulatory Visit (HOSPITAL_COMMUNITY)
Admission: RE | Admit: 2020-10-17 | Discharge: 2020-10-17 | Disposition: A | Discharge status: Home / Self Care | Payer: Medicare Other | Source: Ambulatory Visit | Attending: Student | Admitting: Student

## 2020-10-17 ENCOUNTER — Other Ambulatory Visit: Payer: Self-pay

## 2020-10-17 DIAGNOSIS — E785 Hyperlipidemia, unspecified: Secondary | ICD-10-CM | POA: Insufficient documentation

## 2020-10-17 DIAGNOSIS — I2510 Atherosclerotic heart disease of native coronary artery without angina pectoris: Secondary | ICD-10-CM | POA: Insufficient documentation

## 2020-10-17 LAB — HEPATIC FUNCTION PANEL
ALT: 20 U/L (ref 0–44)
AST: 21 U/L (ref 15–41)
Albumin: 4.3 g/dL (ref 3.5–5.0)
Alkaline Phosphatase: 47 U/L (ref 38–126)
Bilirubin, Direct: 0.1 mg/dL (ref 0.0–0.2)
Indirect Bilirubin: 0.5 mg/dL (ref 0.3–0.9)
Total Bilirubin: 0.6 mg/dL (ref 0.3–1.2)
Total Protein: 7.4 g/dL (ref 6.5–8.1)

## 2020-10-17 LAB — LIPID PANEL
Cholesterol: 82 mg/dL (ref 0–200)
HDL: 38 mg/dL — ABNORMAL LOW (ref >40)
LDL Cholesterol: 16 mg/dL (ref 0–99)
Total CHOL/HDL Ratio: 2.2 RATIO
Triglycerides: 139 mg/dL (ref <150)
VLDL: 28 mg/dL (ref 0–40)

## 2020-10-19 DIAGNOSIS — I10 Essential (primary) hypertension: Secondary | ICD-10-CM | POA: Diagnosis not present

## 2020-10-19 DIAGNOSIS — E119 Type 2 diabetes mellitus without complications: Secondary | ICD-10-CM | POA: Diagnosis not present

## 2020-10-19 DIAGNOSIS — E785 Hyperlipidemia, unspecified: Secondary | ICD-10-CM | POA: Diagnosis not present

## 2020-10-19 DIAGNOSIS — R079 Chest pain, unspecified: Secondary | ICD-10-CM | POA: Diagnosis not present

## 2020-10-19 DIAGNOSIS — R001 Bradycardia, unspecified: Secondary | ICD-10-CM | POA: Diagnosis not present

## 2020-10-19 DIAGNOSIS — F17210 Nicotine dependence, cigarettes, uncomplicated: Secondary | ICD-10-CM | POA: Diagnosis not present

## 2020-10-20 DIAGNOSIS — Z299 Encounter for prophylactic measures, unspecified: Secondary | ICD-10-CM | POA: Diagnosis not present

## 2020-10-20 DIAGNOSIS — I10 Essential (primary) hypertension: Secondary | ICD-10-CM | POA: Diagnosis not present

## 2020-10-20 DIAGNOSIS — R079 Chest pain, unspecified: Secondary | ICD-10-CM | POA: Diagnosis not present

## 2020-10-20 DIAGNOSIS — E1165 Type 2 diabetes mellitus with hyperglycemia: Secondary | ICD-10-CM | POA: Diagnosis not present

## 2020-10-20 DIAGNOSIS — I25119 Atherosclerotic heart disease of native coronary artery with unspecified angina pectoris: Secondary | ICD-10-CM | POA: Diagnosis not present

## 2020-10-20 DIAGNOSIS — F17210 Nicotine dependence, cigarettes, uncomplicated: Secondary | ICD-10-CM | POA: Diagnosis not present

## 2020-10-24 ENCOUNTER — Other Ambulatory Visit: Payer: Self-pay

## 2020-10-24 ENCOUNTER — Ambulatory Visit (INDEPENDENT_AMBULATORY_CARE_PROVIDER_SITE_OTHER): Payer: Medicare Other | Admitting: Cardiovascular Disease

## 2020-10-24 ENCOUNTER — Encounter: Payer: Self-pay | Admitting: Cardiovascular Disease

## 2020-10-24 VITALS — BP 134/70 | HR 69 | Ht 67.0 in | Wt 170.0 lb

## 2020-10-24 DIAGNOSIS — E785 Hyperlipidemia, unspecified: Secondary | ICD-10-CM

## 2020-10-24 DIAGNOSIS — I10 Essential (primary) hypertension: Secondary | ICD-10-CM | POA: Diagnosis not present

## 2020-10-24 DIAGNOSIS — I2510 Atherosclerotic heart disease of native coronary artery without angina pectoris: Secondary | ICD-10-CM

## 2020-10-24 NOTE — Patient Instructions (Signed)
Medication Instructions:  Your physician recommends that you continue on your current medications as directed. Please refer to the Current Medication list given to you today.  *If you need a refill on your cardiac medications before your next appointment, please call your pharmacy*   Lab Work: Fasting lipid and Liver function  If you have labs (blood work) drawn today and your tests are completely normal, you will receive your results only by: Marland Kitchen MyChart Message (if you have MyChart) OR . A paper copy in the mail If you have any lab test that is abnormal or we need to change your treatment, we will call you to review the results.   Testing/Procedures: None today   Follow-Up: At [facility], you and your health needs are our priority.  As part of our continuing mission to provide you with exceptional heart care, we have created designated Provider Care Teams.  These Care Teams include your primary Cardiologist (physician) and Advanced Practice Providers (APPs -  Physician Assistants and Nurse Practitioners) who all work together to provide you with the care you need, when you need it.  We recommend signing up for the patient portal called "MyChart".  Sign up information is provided on this After Visit Summary.  MyChart is used to connect with patients for Virtual Visits (Telemedicine).  Patients are able to view lab/test results, encounter notes, upcoming appointments, etc.  Non-urgent messages can be sent to your provider as well.   To learn more about what you can do with MyChart, go to [email or web address].    Your next appointment:   5 month(s)  The format for your next appointment:   In Person  Provider:   Jenkins Rouge, MD   Other Instructions None      Thank you for choosing [facility] !

## 2020-10-26 ENCOUNTER — Other Ambulatory Visit: Payer: Self-pay

## 2020-10-26 ENCOUNTER — Other Ambulatory Visit (HOSPITAL_COMMUNITY)
Admission: RE | Admit: 2020-10-26 | Discharge: 2020-10-26 | Disposition: A | Discharge status: Home / Self Care | Payer: Medicare Other | Source: Ambulatory Visit | Attending: Cardiovascular Disease | Admitting: Cardiovascular Disease

## 2020-10-26 DIAGNOSIS — E785 Hyperlipidemia, unspecified: Secondary | ICD-10-CM | POA: Insufficient documentation

## 2020-10-26 LAB — HEPATIC FUNCTION PANEL
ALT: 21 U/L (ref 0–44)
AST: 22 U/L (ref 15–41)
Albumin: 4.1 g/dL (ref 3.5–5.0)
Alkaline Phosphatase: 46 U/L (ref 38–126)
Bilirubin, Direct: 0.1 mg/dL (ref 0.0–0.2)
Indirect Bilirubin: 0.4 mg/dL (ref 0.3–0.9)
Total Bilirubin: 0.5 mg/dL (ref 0.3–1.2)
Total Protein: 6.7 g/dL (ref 6.5–8.1)

## 2020-10-26 LAB — LIPID PANEL
Cholesterol: 81 mg/dL (ref 0–200)
HDL: 37 mg/dL — ABNORMAL LOW (ref >40)
LDL Cholesterol: 20 mg/dL (ref 0–99)
Total CHOL/HDL Ratio: 2.2 RATIO
Triglycerides: 119 mg/dL (ref <150)
VLDL: 24 mg/dL (ref 0–40)

## 2020-11-22 DIAGNOSIS — Z299 Encounter for prophylactic measures, unspecified: Secondary | ICD-10-CM | POA: Diagnosis not present

## 2020-11-22 DIAGNOSIS — E1165 Type 2 diabetes mellitus with hyperglycemia: Secondary | ICD-10-CM | POA: Diagnosis not present

## 2020-11-22 DIAGNOSIS — I10 Essential (primary) hypertension: Secondary | ICD-10-CM | POA: Diagnosis not present

## 2020-11-22 DIAGNOSIS — M47819 Spondylosis without myelopathy or radiculopathy, site unspecified: Secondary | ICD-10-CM | POA: Diagnosis not present

## 2020-11-22 DIAGNOSIS — F17210 Nicotine dependence, cigarettes, uncomplicated: Secondary | ICD-10-CM | POA: Diagnosis not present

## 2020-11-30 DIAGNOSIS — M4800 Spinal stenosis, site unspecified: Secondary | ICD-10-CM | POA: Diagnosis not present

## 2020-11-30 DIAGNOSIS — Z0000 Encounter for general adult medical examination without abnormal findings: Secondary | ICD-10-CM | POA: Diagnosis not present

## 2020-11-30 DIAGNOSIS — M47819 Spondylosis without myelopathy or radiculopathy, site unspecified: Secondary | ICD-10-CM | POA: Diagnosis not present

## 2020-11-30 DIAGNOSIS — Z7189 Other specified counseling: Secondary | ICD-10-CM | POA: Diagnosis not present

## 2020-11-30 DIAGNOSIS — Z299 Encounter for prophylactic measures, unspecified: Secondary | ICD-10-CM | POA: Diagnosis not present

## 2020-11-30 DIAGNOSIS — R5383 Other fatigue: Secondary | ICD-10-CM | POA: Diagnosis not present

## 2020-11-30 DIAGNOSIS — E7800 Pure hypercholesterolemia, unspecified: Secondary | ICD-10-CM | POA: Diagnosis not present

## 2020-11-30 DIAGNOSIS — Z79899 Other long term (current) drug therapy: Secondary | ICD-10-CM | POA: Diagnosis not present

## 2020-12-08 DIAGNOSIS — D692 Other nonthrombocytopenic purpura: Secondary | ICD-10-CM | POA: Diagnosis not present

## 2020-12-08 DIAGNOSIS — Z299 Encounter for prophylactic measures, unspecified: Secondary | ICD-10-CM | POA: Diagnosis not present

## 2020-12-08 DIAGNOSIS — I10 Essential (primary) hypertension: Secondary | ICD-10-CM | POA: Diagnosis not present

## 2020-12-08 DIAGNOSIS — J449 Chronic obstructive pulmonary disease, unspecified: Secondary | ICD-10-CM | POA: Diagnosis not present

## 2020-12-08 DIAGNOSIS — I25119 Atherosclerotic heart disease of native coronary artery with unspecified angina pectoris: Secondary | ICD-10-CM | POA: Diagnosis not present

## 2020-12-08 DIAGNOSIS — E1165 Type 2 diabetes mellitus with hyperglycemia: Secondary | ICD-10-CM | POA: Diagnosis not present

## 2021-01-20 ENCOUNTER — Other Ambulatory Visit: Payer: Self-pay | Admitting: Student

## 2021-01-22 NOTE — Telephone Encounter (Signed)
This is a [place] pt.  °

## 2021-02-10 ENCOUNTER — Other Ambulatory Visit: Payer: Self-pay | Admitting: Student

## 2021-03-15 DIAGNOSIS — F17210 Nicotine dependence, cigarettes, uncomplicated: Secondary | ICD-10-CM | POA: Diagnosis not present

## 2021-03-15 DIAGNOSIS — E1165 Type 2 diabetes mellitus with hyperglycemia: Secondary | ICD-10-CM | POA: Diagnosis not present

## 2021-03-15 DIAGNOSIS — I25119 Atherosclerotic heart disease of native coronary artery with unspecified angina pectoris: Secondary | ICD-10-CM | POA: Diagnosis not present

## 2021-03-15 DIAGNOSIS — J449 Chronic obstructive pulmonary disease, unspecified: Secondary | ICD-10-CM | POA: Diagnosis not present

## 2021-03-15 DIAGNOSIS — I10 Essential (primary) hypertension: Secondary | ICD-10-CM | POA: Diagnosis not present

## 2021-03-15 DIAGNOSIS — Z299 Encounter for prophylactic measures, unspecified: Secondary | ICD-10-CM | POA: Diagnosis not present

## 2021-03-21 NOTE — Progress Notes (Signed)
Cardiology Office Note    Date:  03/21/2021   ID:  Richard Rocha, Richard Rocha [birth year], MRN [id number]  PCP:  Monico Blitz, MD  Cardiologist: Jenkins Rouge, MD    No chief complaint on file.   History of Present Illness:    Richard Rocha is a 80 y.o. male with past medical history of CAD (s/p Coronary CT in 03/2020 showing LAD stenosis and cath recommended), HTN, HLD, Type 2 DM and tobacco use  03/28/2020. catheterization showed 70% mid LAD stenosis which was significant by FFR and treated with DES placement. He did have residual 30% ostial LAD stenosis and 30% distal LAD stenosis with no significant plaque along the LCx or RCA. He was started on ASA and Plavix Statin changed to crestor and LDL at goal   F/U myovue 07/21/20 was non ischemic with EF 63%   Discussed stopping plavix now that he is a year out from intervention   He is widowed 3.5 years Has 3 boys but one on drugs and he doesn't see in [place]  Past Medical History:  Diagnosis Date   CAD (coronary artery disease)    a. s/p DES to mid-LAD in 03/2020   Diabetes mellitus type II    Dyslipidemia    Essential hypertension    Gastritis and duodenitis    EGD 03/2012   Hiatal hernia    Schatzki's ring    Tobacco abuse     Past Surgical History:  Procedure Laterality Date   CARDIAC CATHETERIZATION     CHOLECYSTECTOMY     COLONOSCOPY     COLONOSCOPY N/A 09/18/2018   Procedure: COLONOSCOPY;  Surgeon: [name], MD;  [facility];  Service: Endoscopy;  Laterality: N/A;  8:30am   CORONARY STENT INTERVENTION N/A 03/28/2020   Procedure: CORONARY STENT INTERVENTION;  Surgeon: [name], MD;  Location: [facility] LAB;  Service: Cardiovascular;  Laterality: N/A;   ESOPHAGOGASTRODUODENOSCOPY  03/25/2012   Procedure: ESOPHAGOGASTRODUODENOSCOPY (EGD);  Surgeon: [name], MD;  Location: [facility];  Service: Gastroenterology;  Laterality: N/A;   INGUINAL HERNIA REPAIR     bilateral   INTRAVASCULAR  PRESSURE WIRE/FFR STUDY N/A 03/28/2020   Procedure: INTRAVASCULAR PRESSURE WIRE/FFR STUDY;  Surgeon: [name], MD;  Location: [place] LAB;  Service: Cardiovascular;  Laterality: N/A;   JOINT REPLACEMENT     ltknee arthroscopic,rt shoulder arthroscopic   KNEE SURGERY     left arthroscopy   LEFT HEART CATH AND CORONARY ANGIOGRAPHY N/A 03/28/2020   Procedure: LEFT HEART CATH AND CORONARY ANGIOGRAPHY;  Surgeon: [name], MD;  Location: [facility] LAB;  Service: Cardiovascular;  Laterality: N/A;   LEFT HEART CATHETERIZATION WITH CORONARY ANGIOGRAM N/A 03/23/2012   Procedure: LEFT HEART CATHETERIZATION WITH CORONARY ANGIOGRAM;  Surgeon: [name], MD;  Location: [facility] CATH LAB;  Service: Cardiovascular;  Laterality: N/A;   POLYPECTOMY  09/18/2018   Procedure: POLYPECTOMY;  Surgeon: [name], MD;  [facility];  Service: Endoscopy;;  hepatic flexure (CSx2), cecal (CSx1), splenic flexure(CSx2)   SHOULDER SURGERY     right    Current Medications: Outpatient Medications Prior to Visit  Medication Sig Dispense Refill   acetaminophen (TYLENOL) 650 MG CR tablet Take 650-1,300 mg by mouth every 8 (eight) hours as needed for pain.     amLODipine (NORVASC) 5 MG tablet Take 1 tablet (5 mg total) by mouth daily.     aspirin EC 81 MG tablet Take  81 mg by mouth daily.     calcium carbonate (TUMS - DOSED IN MG ELEMENTAL CALCIUM) 500 MG chewable tablet Chew 1-2 tablets by mouth 3 (three) times daily as needed for indigestion or heartburn.     clopidogrel (PLAVIX) 75 MG tablet TAKE ONE TABLET BY MOUTH DAILY WITH BREAKFAST 90 tablet 3   enalapril (VASOTEC) 10 MG tablet Take 20 mg by mouth in the morning and at bedtime.      meclizine (ANTIVERT) 25 MG tablet Take 25 mg by mouth 2 (two) times daily as needed for dizziness.      metFORMIN (GLUCOPHAGE) 500 MG tablet Take 500 mg by mouth daily.   5   metoprolol tartrate (LOPRESSOR) 25 MG tablet Take 25 mg by mouth  daily.     Multiple Vitamin (MULTIVITAMIN WITH MINERALS) TABS Take 1 tablet by mouth daily. Centrum     nitroGLYCERIN (NITROSTAT) 0.4 MG SL tablet Place 1 tablet (0.4 mg total) under the tongue every 5 (five) minutes as needed for chest pain. (Not to exceed 3 in a 15 minute time frame.  If no relief after second dose, call 911. 25 tablet 3   pantoprazole (PROTONIX) 40 MG tablet Take 40 mg by mouth in the morning and at bedtime.     rosuvastatin (CRESTOR) 40 MG tablet TAKE ONE TABLET BY MOUTH DAILY 90 tablet 3   triamcinolone cream (KENALOG) 0.1 % Apply 1 application topically 2 (two) times daily as needed (skin irritation/rash).      No facility-administered medications prior to visit.     Allergies:   Morphine, Morphine and related, Scopolamine, and Scopolamine hbr   Social History   Socioeconomic History   Marital status: Widowed    Spouse name: Not on file   Number of children: 3   Years of education: Not on file   Highest education level: Not on file  Occupational History   Occupation: Truck driving (part time)    Employer: [employer]  Tobacco Use   Smoking status: Every Day    Packs/day: 1.00    Years: 60.00    Pack years: 60.00    Types: Cigarettes    Start date: 08/23/1959   Smokeless tobacco: Never   Tobacco comments:    He has quit many times.  Vaping Use   Vaping Use: Never used  Substance and Sexual Activity   Alcohol use: Yes    Alcohol/week: 1.0 standard drink    Types: 1 Shots of liquor per week    Comment: once in a while   Drug use: No   Sexual activity: Yes    Birth control/protection: None  Other Topics Concern   Not on file  Social History Narrative   Lives with wife.     Social Determinants of Health   Financial Resource Strain: Not on file  Food Insecurity: Not on file  Transportation Needs: Not on file  Physical Activity: Not on file  Stress: Not on file  Social Connections: Not on file     Family History:  The patient's family history  includes Cancer in his father; Coronary artery disease (age of onset: 83) in his brother.   Review of Systems:   Please see the history of present illness.     General:  No chills, fever, night sweats or weight changes.  Cardiovascular:  No chest pain, dyspnea on exertion, edema, orthopnea, palpitations, paroxysmal nocturnal dyspnea. Dermatological: No rash, lesions/masses. Positive for easy bruising.  Respiratory: No cough, dyspnea Urologic: No  hematuria, dysuria Abdominal:   No nausea, vomiting, diarrhea, bright red blood per rectum, melena, or hematemesis Neurologic:  No visual changes, wkns, changes in mental status. All other systems reviewed and are otherwise negative except as noted above.   Physical Exam:    VS:  There were no vitals taken for this visit.   Affect appropriate Healthy:  appears stated age 60: normal Neck supple with no adenopathy JVP normal no bruits no thyromegaly Lungs clear with no wheezing and good diaphragmatic motion Heart:  S1/S2 no murmur, no rub, gallop or click PMI normal Abdomen: benighn, BS positve, no tenderness, no AAA no bruit.  No HSM or HJR Distal pulses intact with no bruits No edema Neuro non-focal Skin warm and dry No muscular weakness     Wt Readings from Last 3 Encounters:  10/24/20 77.1 kg  04/21/20 76.2 kg  03/29/20 73.2 kg     Studies/Labs Reviewed:   EKG:  03/29/2021 SR rate 66 normal  Recent Labs: 03/29/2020: BUN 10; Creatinine, Ser 0.87; Hemoglobin 14.4; Platelets 192; Potassium 3.7; Sodium 140 10/26/2020: ALT 21   Lipid Panel    Component Value Date/Time   CHOL 81 10/26/2020 0839   TRIG 119 10/26/2020 0839   HDL 37 (L) 10/26/2020 0839   CHOLHDL 2.2 10/26/2020 0839   VLDL 24 10/26/2020 0839   LDLCALC 20 10/26/2020 0839    Additional studies/ records that were reviewed today include:   Cardiac Catheterization: 03/2020 Ost LAD to Prox LAD lesion is 30% stenosed. Mid LAD lesion is 70% stenosed.  Significant by DFR. Large plaque burden by IVUS. A drug-eluting stent was successfully placed using a STENT RESOLUTE ONYX 3.5X26. Stent optimized with IVUS. Post intervention, there is a 0% residual stenosis. Dist LAD lesion is 30% stenosed. The left ventricular systolic function is normal. LV end diastolic pressure is normal. The left ventricular ejection fraction is 55-65% by visual estimate. There is no aortic valve stenosis.    Plan:   In order of problems listed above:  1. CAD - Cardiac catheterization 03/28/20  showed 70% mid LAD stenosis which was significant by FFR and treated with DES placement. no significant plaque along the LCx or RCA. D/C plavix continue ASA  - Myovue non ischemic 07/21/20  - continue medical Rx  - has new bottle of nitro   2. HTN - Continue norvasc and enalapril stable   3. HLD - LDL 20 10/26/20 continue Crestor  4. Tobacco Use - He continues to smoke approximately 1 ppd. Cessation advised.  - Lung cancer screening CT   Lung cancer CT D/c Plavix F/U in a year     Signed, Jenkins Rouge, MD  03/21/2021 8:30 AM    [facility]. [address] [place], [place] [address] Phone: [phone] Fax: [phone]

## 2021-03-26 DIAGNOSIS — Z299 Encounter for prophylactic measures, unspecified: Secondary | ICD-10-CM | POA: Diagnosis not present

## 2021-03-26 DIAGNOSIS — F17210 Nicotine dependence, cigarettes, uncomplicated: Secondary | ICD-10-CM | POA: Diagnosis not present

## 2021-03-26 DIAGNOSIS — R21 Rash and other nonspecific skin eruption: Secondary | ICD-10-CM | POA: Diagnosis not present

## 2021-03-26 DIAGNOSIS — I10 Essential (primary) hypertension: Secondary | ICD-10-CM | POA: Diagnosis not present

## 2021-03-29 ENCOUNTER — Ambulatory Visit: Payer: Medicare Other | Admitting: Cardiovascular Disease

## 2021-03-29 ENCOUNTER — Other Ambulatory Visit: Payer: Self-pay

## 2021-03-29 ENCOUNTER — Encounter: Payer: Self-pay | Admitting: Cardiovascular Disease

## 2021-03-29 VITALS — BP 158/80 | HR 60 | Ht 67.0 in | Wt 165.0 lb

## 2021-03-29 DIAGNOSIS — I2510 Atherosclerotic heart disease of native coronary artery without angina pectoris: Secondary | ICD-10-CM | POA: Diagnosis not present

## 2021-03-29 DIAGNOSIS — I10 Essential (primary) hypertension: Secondary | ICD-10-CM | POA: Diagnosis not present

## 2021-03-29 DIAGNOSIS — F17200 Nicotine dependence, unspecified, uncomplicated: Secondary | ICD-10-CM

## 2021-03-29 NOTE — Patient Instructions (Signed)
Medication Instructions:  Your physician recommends that you continue on your current medications as directed. Please refer to the Current Medication list given to you today.  *If you need a refill on your cardiac medications before your next appointment, please call your pharmacy*   Lab Work: NONE   If you have labs (blood work) drawn today and your tests are completely normal, you will receive your results only by: [facility] [place] (if you have MyChart) OR A paper copy in the mail If you have any lab test that is abnormal or we need to change your treatment, we will call you to review the results.   Testing/Procedures: Cancer Screening CT    Follow-Up: At [facility], you and your health needs are our priority.  As part of our continuing mission to provide you with exceptional heart care, we have created designated Provider Care Teams.  These Care Teams include your primary Cardiologist (physician) and Advanced Practice Providers (APPs -  Physician Assistants and Nurse Practitioners) who all work together to provide you with the care you need, when you need it.  We recommend signing up for the patient portal called "MyChart".  Sign up information is provided on this After Visit Summary.  MyChart is used to connect with patients for Virtual Visits (Telemedicine).  Patients are able to view lab/test results, encounter notes, upcoming appointments, etc.  Non-urgent messages can be sent to your provider as well.   To learn more about what you can do with MyChart, go to [email or web address].    Your next appointment:   1 year(s)  The format for your next appointment:   In Person  Provider:   Jenkins Rouge, MD   Other Instructions Thank you for choosing [facility]!

## 2021-04-09 DIAGNOSIS — M79659 Pain in unspecified thigh: Secondary | ICD-10-CM | POA: Diagnosis not present

## 2021-04-09 DIAGNOSIS — E1165 Type 2 diabetes mellitus with hyperglycemia: Secondary | ICD-10-CM | POA: Diagnosis not present

## 2021-04-09 DIAGNOSIS — I10 Essential (primary) hypertension: Secondary | ICD-10-CM | POA: Diagnosis not present

## 2021-04-09 DIAGNOSIS — Z299 Encounter for prophylactic measures, unspecified: Secondary | ICD-10-CM | POA: Diagnosis not present

## 2021-04-09 DIAGNOSIS — F17210 Nicotine dependence, cigarettes, uncomplicated: Secondary | ICD-10-CM | POA: Diagnosis not present

## 2021-04-19 ENCOUNTER — Ambulatory Visit (HOSPITAL_COMMUNITY)
Admission: RE | Admit: 2021-04-19 | Discharge: 2021-04-19 | Disposition: A | Discharge status: Home / Self Care | Payer: Medicare Other | Source: Ambulatory Visit | Attending: Cardiovascular Disease | Admitting: Cardiovascular Disease

## 2021-04-19 ENCOUNTER — Other Ambulatory Visit: Payer: Self-pay

## 2021-04-19 DIAGNOSIS — F17210 Nicotine dependence, cigarettes, uncomplicated: Secondary | ICD-10-CM | POA: Diagnosis not present

## 2021-04-19 DIAGNOSIS — Z122 Encounter for screening for malignant neoplasm of respiratory organs: Secondary | ICD-10-CM | POA: Diagnosis not present

## 2021-04-19 DIAGNOSIS — I700 Atherosclerosis of aorta: Secondary | ICD-10-CM | POA: Diagnosis not present

## 2021-04-19 DIAGNOSIS — J432 Centrilobular emphysema: Secondary | ICD-10-CM | POA: Diagnosis not present

## 2021-04-19 DIAGNOSIS — I2510 Atherosclerotic heart disease of native coronary artery without angina pectoris: Secondary | ICD-10-CM | POA: Insufficient documentation

## 2021-04-19 DIAGNOSIS — J439 Emphysema, unspecified: Secondary | ICD-10-CM | POA: Diagnosis not present

## 2021-04-19 DIAGNOSIS — F17200 Nicotine dependence, unspecified, uncomplicated: Secondary | ICD-10-CM

## 2021-05-18 DIAGNOSIS — M549 Dorsalgia, unspecified: Secondary | ICD-10-CM | POA: Diagnosis not present

## 2021-05-18 DIAGNOSIS — Z299 Encounter for prophylactic measures, unspecified: Secondary | ICD-10-CM | POA: Diagnosis not present

## 2021-05-18 DIAGNOSIS — I10 Essential (primary) hypertension: Secondary | ICD-10-CM | POA: Diagnosis not present

## 2021-05-18 DIAGNOSIS — F17210 Nicotine dependence, cigarettes, uncomplicated: Secondary | ICD-10-CM | POA: Diagnosis not present

## 2021-05-28 ENCOUNTER — Encounter: Payer: Self-pay | Admitting: Interventional Cardiology

## 2021-05-28 NOTE — Telephone Encounter (Signed)
error 

## 2021-05-31 ENCOUNTER — Telehealth: Payer: Self-pay | Admitting: Cardiovascular Disease

## 2021-05-31 NOTE — Telephone Encounter (Signed)
I faxed form to [facility] [address] and sent message to Dr.Nishan's nurse, Howie Ill

## 2021-05-31 NOTE — Telephone Encounter (Signed)
[facility] is wanting to make sure you have their appropriate fax#, it is [phone]

## 2021-05-31 NOTE — Telephone Encounter (Signed)
   Richard Rocha with [facility] calling, she said she faxed a Medical information request for DOT exam last 05/28/21 and following up if its already sighed by the doctor and they need a copy fax back to them. She said she sent it to [employer]  office

## 2021-07-02 DIAGNOSIS — I10 Essential (primary) hypertension: Secondary | ICD-10-CM | POA: Diagnosis not present

## 2021-07-02 DIAGNOSIS — Z23 Encounter for immunization: Secondary | ICD-10-CM | POA: Diagnosis not present

## 2021-07-02 DIAGNOSIS — J449 Chronic obstructive pulmonary disease, unspecified: Secondary | ICD-10-CM | POA: Diagnosis not present

## 2021-07-02 DIAGNOSIS — F17210 Nicotine dependence, cigarettes, uncomplicated: Secondary | ICD-10-CM | POA: Diagnosis not present

## 2021-07-02 DIAGNOSIS — E1165 Type 2 diabetes mellitus with hyperglycemia: Secondary | ICD-10-CM | POA: Diagnosis not present

## 2021-07-02 DIAGNOSIS — Z299 Encounter for prophylactic measures, unspecified: Secondary | ICD-10-CM | POA: Diagnosis not present

## 2021-07-02 DIAGNOSIS — E1142 Type 2 diabetes mellitus with diabetic polyneuropathy: Secondary | ICD-10-CM | POA: Diagnosis not present

## 2021-08-15 DIAGNOSIS — H52203 Unspecified astigmatism, bilateral: Secondary | ICD-10-CM | POA: Diagnosis not present

## 2021-08-15 DIAGNOSIS — H26492 Other secondary cataract, left eye: Secondary | ICD-10-CM | POA: Diagnosis not present

## 2021-08-15 DIAGNOSIS — H527 Unspecified disorder of refraction: Secondary | ICD-10-CM | POA: Diagnosis not present

## 2021-08-15 DIAGNOSIS — H25811 Combined forms of age-related cataract, right eye: Secondary | ICD-10-CM | POA: Diagnosis not present

## 2021-08-15 DIAGNOSIS — H353131 Nonexudative age-related macular degeneration, bilateral, early dry stage: Secondary | ICD-10-CM | POA: Diagnosis not present

## 2021-08-15 DIAGNOSIS — Z961 Presence of intraocular lens: Secondary | ICD-10-CM | POA: Diagnosis not present

## 2021-08-15 DIAGNOSIS — E119 Type 2 diabetes mellitus without complications: Secondary | ICD-10-CM | POA: Diagnosis not present

## 2021-08-28 DIAGNOSIS — F17210 Nicotine dependence, cigarettes, uncomplicated: Secondary | ICD-10-CM | POA: Diagnosis not present

## 2021-08-28 DIAGNOSIS — Z951 Presence of aortocoronary bypass graft: Secondary | ICD-10-CM | POA: Diagnosis not present

## 2021-08-28 DIAGNOSIS — E785 Hyperlipidemia, unspecified: Secondary | ICD-10-CM | POA: Diagnosis not present

## 2021-08-28 DIAGNOSIS — Z955 Presence of coronary angioplasty implant and graft: Secondary | ICD-10-CM | POA: Diagnosis not present

## 2021-08-28 DIAGNOSIS — Z961 Presence of intraocular lens: Secondary | ICD-10-CM | POA: Diagnosis not present

## 2021-08-28 DIAGNOSIS — Z79899 Other long term (current) drug therapy: Secondary | ICD-10-CM | POA: Diagnosis not present

## 2021-08-28 DIAGNOSIS — H25811 Combined forms of age-related cataract, right eye: Secondary | ICD-10-CM | POA: Diagnosis not present

## 2021-08-28 DIAGNOSIS — Z9842 Cataract extraction status, left eye: Secondary | ICD-10-CM | POA: Diagnosis not present

## 2021-08-28 DIAGNOSIS — I10 Essential (primary) hypertension: Secondary | ICD-10-CM | POA: Diagnosis not present

## 2021-08-28 DIAGNOSIS — E1136 Type 2 diabetes mellitus with diabetic cataract: Secondary | ICD-10-CM | POA: Diagnosis not present

## 2021-08-28 DIAGNOSIS — M1990 Unspecified osteoarthritis, unspecified site: Secondary | ICD-10-CM | POA: Diagnosis not present

## 2021-08-28 DIAGNOSIS — I25118 Atherosclerotic heart disease of native coronary artery with other forms of angina pectoris: Secondary | ICD-10-CM | POA: Diagnosis not present

## 2021-09-07 ENCOUNTER — Telehealth: Payer: Self-pay | Admitting: Cardiovascular Disease

## 2021-09-07 DIAGNOSIS — R0789 Other chest pain: Secondary | ICD-10-CM | POA: Diagnosis not present

## 2021-09-07 DIAGNOSIS — I10 Essential (primary) hypertension: Secondary | ICD-10-CM | POA: Diagnosis not present

## 2021-09-07 DIAGNOSIS — Z299 Encounter for prophylactic measures, unspecified: Secondary | ICD-10-CM | POA: Diagnosis not present

## 2021-09-07 DIAGNOSIS — E1165 Type 2 diabetes mellitus with hyperglycemia: Secondary | ICD-10-CM | POA: Diagnosis not present

## 2021-09-07 DIAGNOSIS — F17210 Nicotine dependence, cigarettes, uncomplicated: Secondary | ICD-10-CM | POA: Diagnosis not present

## 2021-09-07 DIAGNOSIS — J449 Chronic obstructive pulmonary disease, unspecified: Secondary | ICD-10-CM | POA: Diagnosis not present

## 2021-09-07 NOTE — Telephone Encounter (Signed)
[facility] Internal medicine, Anahi who called is not available, attempt to reach patient,no voicemail is et up.

## 2021-09-07 NOTE — Telephone Encounter (Signed)
Called to say that the patient heart has been racing was told by the dr to get him an appt. We dont have anything available. Please advise

## 2021-09-07 NOTE — Telephone Encounter (Signed)
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  Will forward this call to our [facility] triage team for further follow-up.

## 2021-09-11 DIAGNOSIS — R072 Precordial pain: Secondary | ICD-10-CM | POA: Diagnosis not present

## 2021-09-11 DIAGNOSIS — Z7982 Long term (current) use of aspirin: Secondary | ICD-10-CM | POA: Diagnosis not present

## 2021-09-11 DIAGNOSIS — Z5329 Procedure and treatment not carried out because of patient's decision for other reasons: Secondary | ICD-10-CM | POA: Diagnosis not present

## 2021-09-11 DIAGNOSIS — Z885 Allergy status to narcotic agent status: Secondary | ICD-10-CM | POA: Diagnosis not present

## 2021-09-11 DIAGNOSIS — F17210 Nicotine dependence, cigarettes, uncomplicated: Secondary | ICD-10-CM | POA: Diagnosis not present

## 2021-09-11 DIAGNOSIS — R079 Chest pain, unspecified: Secondary | ICD-10-CM | POA: Diagnosis not present

## 2021-09-11 DIAGNOSIS — R001 Bradycardia, unspecified: Secondary | ICD-10-CM | POA: Diagnosis not present

## 2021-09-11 DIAGNOSIS — Z7984 Long term (current) use of oral hypoglycemic drugs: Secondary | ICD-10-CM | POA: Diagnosis not present

## 2021-09-11 DIAGNOSIS — E119 Type 2 diabetes mellitus without complications: Secondary | ICD-10-CM | POA: Diagnosis not present

## 2021-09-11 DIAGNOSIS — Z7902 Long term (current) use of antithrombotics/antiplatelets: Secondary | ICD-10-CM | POA: Diagnosis not present

## 2021-09-11 DIAGNOSIS — I2510 Atherosclerotic heart disease of native coronary artery without angina pectoris: Secondary | ICD-10-CM | POA: Diagnosis not present

## 2021-09-11 DIAGNOSIS — Z79899 Other long term (current) drug therapy: Secondary | ICD-10-CM | POA: Diagnosis not present

## 2021-09-11 DIAGNOSIS — E785 Hyperlipidemia, unspecified: Secondary | ICD-10-CM | POA: Diagnosis not present

## 2021-09-11 DIAGNOSIS — I10 Essential (primary) hypertension: Secondary | ICD-10-CM | POA: Diagnosis not present

## 2021-09-13 NOTE — Telephone Encounter (Signed)
Pt has appt with Bernerd Pho, PA-C on 09/19/21 @ 3pm in the [employer]

## 2021-09-19 ENCOUNTER — Telehealth: Payer: Self-pay | Admitting: *Deleted

## 2021-09-19 ENCOUNTER — Emergency Department (HOSPITAL_COMMUNITY): Payer: Medicare Other

## 2021-09-19 ENCOUNTER — Encounter: Payer: Self-pay | Admitting: *Deleted

## 2021-09-19 ENCOUNTER — Ambulatory Visit: Payer: Medicare Other | Admitting: Student

## 2021-09-19 ENCOUNTER — Telehealth: Payer: Self-pay | Admitting: Student

## 2021-09-19 ENCOUNTER — Other Ambulatory Visit: Payer: Self-pay

## 2021-09-19 ENCOUNTER — Encounter: Payer: Self-pay | Admitting: Student

## 2021-09-19 ENCOUNTER — Emergency Department (HOSPITAL_COMMUNITY)
Admission: EM | Admit: 2021-09-19 | Discharge: 2021-09-19 | Disposition: A | Discharge status: Home / Self Care | Payer: Medicare Other | Attending: Emergency Medicine | Admitting: Emergency Medicine

## 2021-09-19 ENCOUNTER — Encounter (HOSPITAL_COMMUNITY): Payer: Self-pay

## 2021-09-19 VITALS — BP 134/60 | HR 66 | Ht 67.0 in | Wt 167.0 lb

## 2021-09-19 DIAGNOSIS — I25118 Atherosclerotic heart disease of native coronary artery with other forms of angina pectoris: Secondary | ICD-10-CM

## 2021-09-19 DIAGNOSIS — Z7982 Long term (current) use of aspirin: Secondary | ICD-10-CM | POA: Insufficient documentation

## 2021-09-19 DIAGNOSIS — E785 Hyperlipidemia, unspecified: Secondary | ICD-10-CM

## 2021-09-19 DIAGNOSIS — R0789 Other chest pain: Secondary | ICD-10-CM | POA: Diagnosis not present

## 2021-09-19 DIAGNOSIS — I10 Essential (primary) hypertension: Secondary | ICD-10-CM

## 2021-09-19 DIAGNOSIS — I2510 Atherosclerotic heart disease of native coronary artery without angina pectoris: Secondary | ICD-10-CM

## 2021-09-19 DIAGNOSIS — R079 Chest pain, unspecified: Secondary | ICD-10-CM

## 2021-09-19 LAB — CBC
HCT: 45.4 % (ref 39.0–52.0)
Hemoglobin: 15 g/dL (ref 13.0–17.0)
MCH: 30.8 pg (ref 26.0–34.0)
MCHC: 33 g/dL (ref 30.0–36.0)
MCV: 93.2 fL (ref 80.0–100.0)
Platelets: 189 10*3/uL (ref 150–400)
RBC: 4.87 MIL/uL (ref 4.22–5.81)
RDW: 13.2 % (ref 11.5–15.5)
WBC: 7 10*3/uL (ref 4.0–10.5)
nRBC: 0 % (ref 0.0–0.2)

## 2021-09-19 LAB — BASIC METABOLIC PANEL
Anion gap: 9 (ref 5–15)
BUN: 16 mg/dL (ref 8–23)
CO2: 25 mmol/L (ref 22–32)
Calcium: 9.3 mg/dL (ref 8.9–10.3)
Chloride: 104 mmol/L (ref 98–111)
Creatinine, Ser: 0.83 mg/dL (ref 0.61–1.24)
GFR, Estimated: >60 mL/min (ref >60)
Glucose, Bld: 165 mg/dL — ABNORMAL HIGH (ref 70–99)
Potassium: 4.2 mmol/L (ref 3.5–5.1)
Sodium: 138 mmol/L (ref 135–145)

## 2021-09-19 LAB — TROPONIN I (HIGH SENSITIVITY)
Troponin I (High Sensitivity): 4 ng/L (ref <18)
Troponin I (High Sensitivity): 4 ng/L (ref <18)

## 2021-09-19 NOTE — ED Provider Notes (Signed)
[facility] EMERGENCY DEPARTMENT Provider Note   CSN: [id number] Arrival date & time: 09/19/21  0847     History  Chief Complaint  Patient presents with   Chest Pain    Richard Rocha is a 81 y.o. male.  Patient with a history of coronary artery disease and 1 stent.  He had some chest pain 3 weeks ago.  Then he had chest pain today that lasted about 20 minutes and was relieved by nitroglycerin.  The history is provided by the patient and medical records. No language interpreter was used.  Chest Pain Pain location:  L chest Pain quality: aching   Pain radiates to:  Does not radiate Pain severity:  Mild Onset quality:  Sudden Timing:  Intermittent Progression:  Worsening Chronicity:  New Context: not breathing   Relieved by:  Nothing Associated symptoms: no abdominal pain, no back pain, no cough, no fever, no palpitations, no shortness of breath and no vomiting       Home Medications Prior to Admission medications   Medication Sig Start Date End Date Taking? Authorizing Provider  acetaminophen (TYLENOL) 650 MG CR tablet Take 650-1,300 mg by mouth every 8 (eight) hours as needed for pain.   Yes [provider]  amLODipine (NORVASC) 5 MG tablet Take 1 tablet (5 mg total) by mouth daily. 03/24/20  Yes Strader, Fransisco Hertz, PA-C  aspirin EC 81 MG tablet Take 81 mg by mouth daily.   Yes [provider]  enalapril (VASOTEC) 10 MG tablet Take 20 mg by mouth in the morning and at bedtime.    Yes [provider]  meclizine (ANTIVERT) 25 MG tablet Take 25 mg by mouth 2 (two) times daily as needed for dizziness.    Yes [provider]  metFORMIN (GLUCOPHAGE) 500 MG tablet Take 500 mg by mouth daily.  02/15/15  Yes [provider]  Multiple Vitamin (MULTIVITAMIN WITH MINERALS) TABS Take 1 tablet by mouth daily. Centrum   Yes [provider]  pantoprazole (PROTONIX) 40 MG tablet Take 40 mg by mouth daily as needed (gerd). 02/08/20  Yes  [provider]  rosuvastatin (CRESTOR) 40 MG tablet TAKE ONE TABLET BY MOUTH DAILY Patient taking differently: Take 40 mg by mouth daily. 01/22/21  Yes [name], MD  triamcinolone cream (KENALOG) 0.1 % Apply 1 application topically 2 (two) times daily as needed (skin irritation/rash).  06/17/16  Yes [provider]  clopidogrel (PLAVIX) 75 MG tablet TAKE ONE TABLET BY MOUTH DAILY WITH BREAKFAST Patient not taking: Reported on 09/19/2021 02/12/21   Erma Heritage, PA-C  metoprolol tartrate (LOPRESSOR) 25 MG tablet Take 25 mg by mouth daily. Patient not taking: Reported on 09/19/2021    [provider]  nitroGLYCERIN (NITROSTAT) 0.4 MG SL tablet Place 1 tablet (0.4 mg total) under the tongue every 5 (five) minutes as needed for chest pain. (Not to exceed 3 in a 15 minute time frame.  If no relief after second dose, call 911. 05/10/15   Herminio Commons, MD      Allergies    Morphine, Morphine and related, Scopolamine, and Scopolamine hbr    Review of Systems   Review of Systems  Constitutional:  Negative for chills and fever.  HENT:  Negative for ear pain and sore throat.   Eyes:  Negative for pain and visual disturbance.  Respiratory:  Negative for cough and shortness of breath.   Cardiovascular:  Positive for chest pain. Negative for palpitations.  Gastrointestinal:  Negative for abdominal pain and vomiting.  Genitourinary:  Negative for dysuria and hematuria.  Musculoskeletal:  Negative for arthralgias and back pain.  Skin:  Negative for color change and rash.  Neurological:  Negative for seizures and syncope.  All other systems reviewed and are negative.  Physical Exam Updated Vital Signs BP (!) 151/78    Pulse (!) 51    Resp 15    Ht 5\' 7"  (1.702 m)    Wt 74.8 kg    SpO2 96%    BMI 25.84 kg/m  Physical Exam Vitals and nursing note reviewed.  Constitutional:      Appearance: He is well-developed.  HENT:     Head: Normocephalic.     Nose:  Nose normal.  Eyes:     General: No scleral icterus.    Conjunctiva/sclera: Conjunctivae normal.  Neck:     Thyroid: No thyromegaly.  Cardiovascular:     Rate and Rhythm: Normal rate and regular rhythm.     Heart sounds: No murmur heard.   No friction rub. No gallop.  Pulmonary:     Breath sounds: No stridor. No wheezing or rales.  Chest:     Chest wall: No tenderness.  Abdominal:     General: There is no distension.     Tenderness: There is no abdominal tenderness. There is no rebound.  Musculoskeletal:        General: Normal range of motion.     Cervical back: Neck supple.  Lymphadenopathy:     Cervical: No cervical adenopathy.  Skin:    Findings: No erythema or rash.  Neurological:     Mental Status: He is oriented to person, place, and time.     Motor: No abnormal muscle tone.     Coordination: Coordination normal.  Psychiatric:        Behavior: Behavior normal.    ED Results / Procedures / Treatments   Labs (all labs ordered are listed, but only abnormal results are displayed) Labs Reviewed  BASIC METABOLIC PANEL - Abnormal; Notable for the following components:      Result Value   Glucose, Bld 165 (*)    All other components within normal limits  CBC  TROPONIN I (HIGH SENSITIVITY)  TROPONIN I (HIGH SENSITIVITY)    EKG EKG Interpretation  Date/Time:  Wednesday September 19 2021 08:58:56 EST Ventricular Rate:  58 PR Interval:  149 QRS Duration: 111 QT Interval:  449 QTC Calculation: 441 R Axis:   14 Text Interpretation: Sinus rhythm RSR' in V1 or V2, probably normal variant Confirmed by Milton Ferguson [phone]) on 09/19/2021 12:15:17 PM  Radiology DG Chest Port 1 View  Result Date: 09/19/2021 CLINICAL DATA:  Chest pain today. EXAM: PORTABLE CHEST 1 VIEW COMPARISON:  Radiographs 09/11/2021 and 10/19/2020.  CT 04/19/2021. FINDINGS: 0926 hours. The heart size and mediastinal contours are normal. The lungs are clear. There is no pleural effusion or pneumothorax.  No acute osseous findings are identified. Telemetry leads overlie the chest. IMPRESSION: Stable chest.  No active cardiopulmonary process. Electronically Signed   By: Richardean Sale M.D.   On: 09/19/2021 09:37    Procedures Procedures    Medications Ordered in ED Medications - No data to display  ED Course/ Medical Decision Making/ A&P                           Medical Decision Making  Troponins and EKG and rest of his labs unremarkable.  Patient  without pain right now.  I spoke with cardiology and they felt like the patient could be discharged but they do want to see him at 3:00 which is his normal appointment   This patient presents to the ED for concern of chest pain, this involves an extensive number of treatment options, and is a complaint that carries with it a high risk of complications and morbidity.  The differential diagnosis includes unstable angina, PE, pneumonia   Co morbidities that complicate the patient evaluation  Coronary artery disease   Additional history obtained:  Additional history obtained from patient External records from outside source obtained and reviewed including old records   Lab Tests:  I Ordered, and personally interpreted labs.  The pertinent results include: Glucose mildly elevated at 165   Imaging Studies ordered:  I ordered imaging studies including chest x-ray I independently visualized and interpreted imaging which showed unremarkable I agree with the radiologist interpretation   Cardiac Monitoring:  The patient was maintained on a cardiac monitor.  I personally viewed and interpreted the cardiac monitored which showed an underlying rhythm of: Normal sinus rhythm   Medicines ordered and prescription drug management:  No medicines given no medicines given Reevaluation of the patient after these medicines showed that the patient stayed the same I have reviewed the patients home medicines and have made adjustments as  needed   Test Considered:  CT chest   Critical Interventions:  None   Consultations Obtained:  I requested consultation with the cardiology,  and discussed lab and imaging findings as well as pertinent plan - they recommend: Discharge home and follow-up with cardiology at 3 PM in the office  Problem List / ED Course:  Coronary artery disease and chest pain   Reevaluation:  After the interventions noted above, I reevaluated the patient and found that they have :improved   Social Determinants of Health:  None   Dispostion:  After consideration of the diagnostic results and the patients response to treatment, I feel that the patent would benefit from discharge home follow-up with cardiology in the afternoon.         Final Clinical Impression(s) / ED Diagnoses Final diagnoses:  Atypical chest pain    Rx / DC Orders ED Discharge Orders     None         Milton Ferguson, MD 09/22/21 1008

## 2021-09-19 NOTE — Progress Notes (Signed)
Cardiology Office Note    Date:  09/20/2021   ID:  Richard Rocha, Richard Rocha [birth year], MRN [id number]  PCP:  Monico Blitz, MD  Cardiologist: Jenkins Rouge, MD    Chief Complaint  Patient presents with   Follow-up    Recent ER visits for chest pain    History of Present Illness:    Richard Rocha is a 81 y.o. male with past medical history of CAD (s/p Coronary CT in 03/2020 showing LAD stenosis and cath recommended --> DES to mid-LAD with residual 30% ostial LAD stenosis and 30% distal LAD stenosis), HTN, HLD, Type 2 DM and tobacco use who presents to the office today for evaluation of chest pain.  He was last examined by Dr. Johnsie Cancel in 03/2021 and denied any recent anginal symptoms at that time. Plavix was discontinued given he was a year out from intervention. He did have a lung cancer screening Chest CT which showed emphysema suggestive of COPD but no suspicious lesions.  By review of Care Everywhere, he did go to [facility] on 09/11[facility]2021 for evaluation of chest pain which had started earlier that night. His Troponin values were negative and it appears he left AMA prior to discharge.   He walked into the office this morning reporting ongoing chest pain and was sent to the ED for evaluation. Initial and repeat Hs Troponin values were negative at 4. EKG showed sinus bradycardia, HR 58 with no acute ST changes. He was discharged home and informed to keep scheduled Cardiology follow-up for this afternoon.   In talking with the patient today, he reports his episodes of chest pain have mostly been at night and will awaken him from sleep. Did have SL NTG last night with resolution of his pain. Says he does snack right before going to bed but has been compliant with Protonix 40mg  daily. 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 Has some dyspnea on exertion. No orthopnea, PND or pitting edema.    Past Medical  History:  Diagnosis Date   CAD (coronary artery disease)    a. s/p DES to mid-LAD in 03/2020   Diabetes mellitus type II    Dyslipidemia    Essential hypertension    Gastritis and duodenitis    EGD 03/2012   Hiatal hernia    Schatzki's ring    Tobacco abuse     Past Surgical History:  Procedure Laterality Date   CARDIAC CATHETERIZATION     CHOLECYSTECTOMY     COLONOSCOPY     COLONOSCOPY N/A 09/18/2018   Procedure: COLONOSCOPY;  Surgeon: [name], MD;  [facility];  Service: Endoscopy;  Laterality: N/A;  8:30am   CORONARY STENT INTERVENTION N/A 03/28/2020   Procedure: CORONARY STENT INTERVENTION;  Surgeon: [name], MD;  Location: [place] [facility];  Service: Cardiovascular;  Laterality: N/A;   ESOPHAGOGASTRODUODENOSCOPY  03/25/2012   Procedure: ESOPHAGOGASTRODUODENOSCOPY (EGD);  Surgeon: [name], MD;  Location: [place];  Service: Gastroenterology;  Laterality: N/A;   INGUINAL HERNIA REPAIR     bilateral   INTRAVASCULAR PRESSURE WIRE/FFR STUDY N/A 03/28/2020   Procedure: INTRAVASCULAR PRESSURE WIRE/FFR STUDY;  Surgeon: [name], MD;  Location: [place] [facility];  Service: Cardiovascular;  Laterality: N/A;   JOINT REPLACEMENT     ltknee arthroscopic,rt shoulder arthroscopic   KNEE SURGERY     left arthroscopy  LEFT HEART CATH AND CORONARY ANGIOGRAPHY N/A 03/28/2020   Procedure: LEFT HEART CATH AND CORONARY ANGIOGRAPHY;  Surgeon: [name], MD;  Location: [place] [facility];  Service: Cardiovascular;  Laterality: N/A;   LEFT HEART CATHETERIZATION WITH CORONARY ANGIOGRAM N/A 03/23/2012   Procedure: LEFT HEART CATHETERIZATION WITH CORONARY ANGIOGRAM;  Surgeon: [name], MD;  Location: [facility] CATH LAB;  Service: Cardiovascular;  Laterality: N/A;   POLYPECTOMY  09/18/2018   Procedure: POLYPECTOMY;  Surgeon: [name], MD;  Location: [facility];  Service: Endoscopy;;  hepatic flexure (CSx2), cecal (CSx1), splenic  flexure(CSx2)   SHOULDER SURGERY     right    Current Medications: Outpatient Medications Prior to Visit  Medication Sig Dispense Refill   acetaminophen (TYLENOL) 650 MG CR tablet Take 650-1,300 mg by mouth every 8 (eight) hours as needed for pain.     amLODipine (NORVASC) 5 MG tablet Take 1 tablet (5 mg total) by mouth daily.     aspirin EC 81 MG tablet Take 81 mg by mouth daily.     enalapril (VASOTEC) 10 MG tablet Take 20 mg by mouth in the morning and at bedtime.      meclizine (ANTIVERT) 25 MG tablet Take 25 mg by mouth 2 (two) times daily as needed for dizziness.      metFORMIN (GLUCOPHAGE) 500 MG tablet Take 500 mg by mouth daily.   5   Multiple Vitamin (MULTIVITAMIN WITH MINERALS) TABS Take 1 tablet by mouth daily. Centrum     nitroGLYCERIN (NITROSTAT) 0.4 MG SL tablet Place 1 tablet (0.4 mg total) under the tongue every 5 (five) minutes as needed for chest pain. (Not to exceed 3 in a 15 minute time frame.  If no relief after second dose, call 911. 25 tablet 3   pantoprazole (PROTONIX) 40 MG tablet Take 40 mg by mouth daily as needed (gerd).     rosuvastatin (CRESTOR) 40 MG tablet TAKE ONE TABLET BY MOUTH DAILY (Patient taking differently: Take 40 mg by mouth daily.) 90 tablet 3   triamcinolone cream (KENALOG) 0.1 % Apply 1 application topically 2 (two) times daily as needed (skin irritation/rash).      clopidogrel (PLAVIX) 75 MG tablet TAKE ONE TABLET BY MOUTH DAILY WITH BREAKFAST (Patient not taking: Reported on 09/19/2021) 90 tablet 3   metoprolol tartrate (LOPRESSOR) 25 MG tablet Take 25 mg by mouth daily. (Patient not taking: Reported on 09/19/2021)     No facility-administered medications prior to visit.     Allergies:   Morphine, Morphine and related, Scopolamine, and Scopolamine hbr   Social History   Socioeconomic History   Marital status: Widowed    Spouse name: Not on file   Number of children: 3   Years of education: Not on file   Highest education level: Not on  file  Occupational History   Occupation: Truck driving (part time)    Employer: [employer]  Tobacco Use   Smoking status: Every Day    Packs/day: 1.00    Years: 60.00    Pack years: 60.00    Types: Cigarettes    Start date: 08/23/1959   Smokeless tobacco: Never   Tobacco comments:    He has quit many times.  Vaping Use   Vaping Use: Never used  Substance and Sexual Activity   Alcohol use: Yes    Alcohol/week: 1.0 standard drink    Types: 1 Shots of liquor per week    Comment: once in a while  Drug use: No   Sexual activity: Yes    Birth control/protection: None  Other Topics Concern   Not on file  Social History Narrative   Lives with wife.     Social Determinants of Health   Financial Resource Strain: Not on file  Food Insecurity: Not on file  Transportation Needs: Not on file  Physical Activity: Not on file  Stress: Not on file  Social Connections: Not on file     Family History:  The patient's family history includes Cancer in his father; Coronary artery disease (age of onset: 39) in his brother.   Review of Systems:    Please see the history of present illness.     All other systems reviewed and are otherwise negative except as noted above.   Physical Exam:    VS:  BP 134/60    Pulse 66    Ht 5\' 7"  (1.702 m)    Wt 167 lb (75.8 kg)    SpO2 96%    BMI 26.16 kg/m    General: Well developed, well nourished,male appearing in no acute distress. Head: Normocephalic, atraumatic. Neck: No carotid bruits. JVD not elevated.  Lungs: Respirations regular and unlabored, without wheezes or rales.  Heart: Regular rate and rhythm. No S3 or S4.  No murmur, no rubs, or gallops appreciated. Abdomen: Appears non-distended. No obvious abdominal masses. Msk:  Strength and tone appear normal for age. No obvious joint deformities or effusions. Extremities: No clubbing or cyanosis. No pitting edema.  Distal pedal pulses are 2+ bilaterally. Neuro: Alert and oriented X 3. Moves  all extremities spontaneously. No focal deficits noted. Psych:  Responds to questions appropriately with a normal affect. Skin: No rashes or lesions noted  Wt Readings from Last 3 Encounters:  09/19/21 167 lb (75.8 kg)  09/19/21 165 lb (74.8 kg)  03/29/21 165 lb (74.8 kg)     Studies/Labs Reviewed:   EKG:  EKG is not ordered today. EKG that was performed in the ED is reviewed and shows sinus bradycardia, HR 58 with no acute ST changes.  Recent Labs: 10/26/2020: ALT 21 09/19/2021: BUN 16; Creatinine, Ser 0.83; Hemoglobin 15.0; Platelets 189; Potassium 4.2; Sodium 138   Lipid Panel    Component Value Date/Time   CHOL 81 10/26/2020 0839   TRIG 119 10/26/2020 0839   HDL 37 (L) 10/26/2020 0839   CHOLHDL 2.2 10/26/2020 0839   VLDL 24 10/26/2020 0839   LDLCALC 20 10/26/2020 0839    Additional studies/ records that were reviewed today include:   Cardiac Catheterization: 03/2020 Ost LAD to Prox LAD lesion is 30% stenosed. Mid LAD lesion is 70% stenosed. Significant by DFR. Large plaque burden by IVUS. A drug-eluting stent was successfully placed using a STENT RESOLUTE ONYX 3.5X26. Stent optimized with IVUS. Post intervention, there is a 0% residual stenosis. Dist LAD lesion is 30% stenosed. The left ventricular systolic function is normal. LV end diastolic pressure is normal. The left ventricular ejection fraction is 55-65% by visual estimate. There is no aortic valve stenosis.   Continue aggressive secondary prevention including smoking cessation.  Watch overnight.  Plan for discharge tomorrow.    NST: 07/2020 No diagnostic ST segment changes to indicate ischemia. Small, mild intensity, mid to basal inferoseptal/inferior defect that is fixed and most consistent with soft tissue attenuation in light of normal wall motion. No large ischemic territories. This is a low risk study. Nuclear stress EF: 63%.  Assessment:    1. Chest pain, unspecified type  2. Coronary artery  disease involving native coronary artery of native heart with other form of angina pectoris ([place])   3. Essential hypertension   4. Hyperlipidemia LDL goal <70      Plan:   In order of problems listed above:  1. Chest Pain with Mixed Features - His chest pain has atypical features as it is not associated with activity and has mostly occurred at night. He does consume snacks before going to bed and we reviewed trying to limit this. He does take Protonix 40mg  daily.  - Given his recurrent pain which has prompted two visits to the Emergency [facility] Troponin values negative both times and EKG tracings without acute ST changes), will plan for a Lexiscan Myoview to rule-out any significant ischemia. We discussed adding Imdur today but will hold off until results are available as he prefers to not be on additional medications if they can be avoided.   - Continue ASA, Crestor, Amlodipine and Enalapril at current dosing.   2. CAD - He underwent DES to the mid-LAD with residual 30% ostial LAD stenosis and 30% distal LAD stenosis in 03/2020 and had a low-risk NST in 07/2020. Will plan for repeat ischemic evaluation as outlined above.  - Continue ASA 81mg  daily, Crestor 40mg  daily and SL NTG. Plavix was stopped at his last office visit and will remove this from his medication list. He is not on a BB due to baseline bradycardia (was still on his medication list from prior Coronary CT).   3. HTN - His BP is well-controlled at 134/60 during today's visit. Continue current medication regimen with Amlodipine 5mg  daily and Enalapril 20mg  BID.   4. HLD - FLP in 10/2020 showed total cholesterol at 81, triglycerides 119, HDL 37 and LDL 20. Continue Crestor 40mg  daily.    Medication Adjustments/Labs and Tests Ordered: Current medicines are reviewed at length with the patient today.  Concerns regarding medicines are outlined above.  Medication changes, Labs and Tests ordered today are listed in the Patient  Instructions below. Patient Instructions  Medication Instructions:  Your physician recommends that you continue on your current medications as directed. Please refer to the Current Medication list given to you today.  Stop Taking Plavix   *If you need a refill on your cardiac medications before your next appointment, please call your pharmacy*   Lab Work: NONE   If you have labs (blood work) drawn today and your tests are completely normal, you will receive your results only by: [place] (if you have MyChart) OR A paper copy in the mail If you have any lab test that is abnormal or we need to change your treatment, we will call you to review the results.   Testing/Procedures: Your physician has requested that you have a lexiscan myoview. For further information please visit [email or web address]. Please follow instruction sheet, as given.    Follow-Up: At [facility], you and your health needs are our priority.  As part of our continuing mission to provide you with exceptional heart care, we have created designated Provider Care Teams.  These Care Teams include your primary Cardiologist (physician) and Advanced Practice Providers (APPs -  Physician Assistants and Nurse Practitioners) who all work together to provide you with the care you need, when you need it.  We recommend signing up for the patient portal called "MyChart".  Sign up information is provided on this After Visit Summary.  MyChart is used to connect with patients for Virtual Visits (Telemedicine).  Patients are able to view lab/test results, encounter notes, upcoming appointments, etc.  Non-urgent messages can be sent to your provider as well.   To learn more about what you can do with MyChart, go to [email or web address].    Your next appointment:   1 month(s)  The format for your next appointment:   In Person  Provider:   Jenkins Rouge, MD    Other Instructions Thank you for choosing [facility]!      Signed, Erma Heritage, PA-C  09/20/2021 8:18 AM    [place]. [address] [place], [place] [address] Phone: [phone] Fax: [phone]

## 2021-09-19 NOTE — Telephone Encounter (Signed)
Pt walked in office c/o chest pain that started 3 weeks ago and stopped. Pt states that chest pain came back this morning around 5 and has been on and off. Pt denies SOB but does c/o nausea at times. Rates pain 4/10. Pt did take one nitro with some relief. Encouraged pt to be seen in ER for evaluation.

## 2021-09-19 NOTE — Patient Instructions (Signed)
Medication Instructions:  Your physician recommends that you continue on your current medications as directed. Please refer to the Current Medication list given to you today.  Stop Taking Plavix   *If you need a refill on your cardiac medications before your next appointment, please call your pharmacy*   Lab Work: NONE   If you have labs (blood work) drawn today and your tests are completely normal, you will receive your results only by: [place] (if you have MyChart) OR A paper copy in the mail If you have any lab test that is abnormal or we need to change your treatment, we will call you to review the results.   Testing/Procedures: Your physician has requested that you have a lexiscan myoview. For further information please visit [email or web address]. Please follow instruction sheet, as given.    Follow-Up: At [facility], you and your health needs are our priority.  As part of our continuing mission to provide you with exceptional heart care, we have created designated Provider Care Teams.  These Care Teams include your primary Cardiologist (physician) and Advanced Practice Providers (APPs -  Physician Assistants and Nurse Practitioners) who all work together to provide you with the care you need, when you need it.  We recommend signing up for the patient portal called "MyChart".  Sign up information is provided on this After Visit Summary.  MyChart is used to connect with patients for Virtual Visits (Telemedicine).  Patients are able to view lab/test results, encounter notes, upcoming appointments, etc.  Non-urgent messages can be sent to your provider as well.   To learn more about what you can do with MyChart, go to [email or web address].    Your next appointment:   1 month(s)  The format for your next appointment:   In Person  Provider:   Jenkins Rouge, MD    Other Instructions Thank you for choosing [facility]!

## 2021-09-19 NOTE — Telephone Encounter (Signed)
Unfortunately, this is beyond the scope of preop clearance. It appears patient has been sent to the Emergency room. I will remove him from the preop pool.

## 2021-09-19 NOTE — Telephone Encounter (Signed)
Pt walked in and said he was having on going chest pain and he had a appointment with [facility] at 3pm this afternoon. Asked if we could see him sooner made him aware of the clinic being full this AM. Told him I would send a message to the nurse.

## 2021-09-19 NOTE — Discharge Instructions (Signed)
Follow-up with your cardiologist at 3:00 today as planned

## 2021-09-19 NOTE — ED Triage Notes (Signed)
Patient complaining of chest pain that started 3 hours prior. Took one nitro with relief. Denies shortness of breath.

## 2021-09-20 ENCOUNTER — Encounter: Payer: Self-pay | Admitting: Student

## 2021-09-21 ENCOUNTER — Encounter (HOSPITAL_COMMUNITY)
Admission: RE | Admit: 2021-09-21 | Discharge: 2021-09-21 | Disposition: A | Discharge status: Home / Self Care | Payer: Medicare Other | Source: Ambulatory Visit | Attending: Student | Admitting: Student

## 2021-09-21 ENCOUNTER — Other Ambulatory Visit: Payer: Self-pay

## 2021-09-21 ENCOUNTER — Ambulatory Visit (HOSPITAL_COMMUNITY)
Admission: RE | Admit: 2021-09-21 | Discharge: 2021-09-21 | Disposition: A | Discharge status: Home / Self Care | Payer: No Typology Code available for payment source | Source: Ambulatory Visit | Attending: Student | Admitting: Student

## 2021-09-21 ENCOUNTER — Encounter (HOSPITAL_COMMUNITY): Payer: Self-pay

## 2021-09-21 DIAGNOSIS — Z299 Encounter for prophylactic measures, unspecified: Secondary | ICD-10-CM | POA: Diagnosis not present

## 2021-09-21 DIAGNOSIS — E1165 Type 2 diabetes mellitus with hyperglycemia: Secondary | ICD-10-CM | POA: Diagnosis not present

## 2021-09-21 DIAGNOSIS — I10 Essential (primary) hypertension: Secondary | ICD-10-CM | POA: Diagnosis not present

## 2021-09-21 DIAGNOSIS — I25119 Atherosclerotic heart disease of native coronary artery with unspecified angina pectoris: Secondary | ICD-10-CM | POA: Diagnosis not present

## 2021-09-21 DIAGNOSIS — R079 Chest pain, unspecified: Secondary | ICD-10-CM | POA: Diagnosis not present

## 2021-09-21 DIAGNOSIS — F17210 Nicotine dependence, cigarettes, uncomplicated: Secondary | ICD-10-CM | POA: Diagnosis not present

## 2021-09-21 DIAGNOSIS — J449 Chronic obstructive pulmonary disease, unspecified: Secondary | ICD-10-CM | POA: Diagnosis not present

## 2021-09-21 LAB — NM MYOCAR MULTI W/SPECT W/WALL MOTION / EF
LV dias vol: 95 mL (ref 62–150)
LV sys vol: 35 mL
Nuc Stress EF: 63 %
Peak HR: 86 {beats}/min
RATE: 0.4
Rest HR: 53 {beats}/min
Rest Nuclear Isotope Dose: 11 mCi
SDS: 0
SRS: 5
SSS: 5
ST Depression (mm): 0 mm
Stress Nuclear Isotope Dose: 32.5 mCi
TID: 1.08

## 2021-09-21 MED ORDER — SODIUM CHLORIDE FLUSH 0.9 % IV SOLN
INTRAVENOUS | Status: AC
  Administered 2021-09-21: 10 mL via INTRAVENOUS
  Filled 2021-09-21: qty 10

## 2021-09-21 MED ORDER — TECHNETIUM TC 99M TETROFOSMIN IV KIT
30.0000 | PACK | Freq: Once | INTRAVENOUS | Status: AC | PRN
  Administered 2021-09-21: 32.5 via INTRAVENOUS

## 2021-09-21 MED ORDER — REGADENOSON 0.4 MG/5ML IV SOLN
INTRAVENOUS | Status: AC
  Administered 2021-09-21: 0.4 mg via INTRAVENOUS
  Filled 2021-09-21: qty 5

## 2021-09-21 MED ORDER — TECHNETIUM TC 99M TETROFOSMIN IV KIT
10.0000 | PACK | Freq: Once | INTRAVENOUS | Status: AC | PRN
  Administered 2021-09-21: 11 via INTRAVENOUS

## 2021-09-25 ENCOUNTER — Telehealth: Payer: Self-pay | Admitting: Student

## 2021-09-25 MED ORDER — ISOSORBIDE MONONITRATE ER 30 MG PO TB24: 15.0000 mg | ORAL_TABLET | Freq: Every day | ORAL | 3 refills | Status: DC

## 2021-09-25 NOTE — Telephone Encounter (Signed)
Richard Rocha, [place] M, PA-C  [place], [place] G, LPN Please let the patient know his stress test showed a likely fixed defect consistent with scar or artifact. Possible area of very mild ischemia but no evidence of significant blockages. Pumping function of his heart remains normal. Overall, a low-risk study.  I would recommend adding Imdur 30mg  daily to his medication regimen to see if this helps with his symptoms. Can start at 15mg  daily if he has a headache with the medication. Keep scheduled follow-up with Dr. Johnsie Cancel next month. If symptoms improve, would favor continued medical management. If recurrent symptoms despite medical therapy, can consider a cardiac catheterization for definitive evaluation.

## 2021-09-25 NOTE — Telephone Encounter (Signed)
I spoke with patient and discussed test results. He agrees to start Imdur 15 mg daily to start.e-scribed to [employer]

## 2021-09-25 NOTE — Telephone Encounter (Signed)
Pt walked in wanting to know the results for stress test that was preformed on Friday 09/21/21. Informed him he would be contacted when they were in.

## 2021-09-27 DIAGNOSIS — J449 Chronic obstructive pulmonary disease, unspecified: Secondary | ICD-10-CM | POA: Diagnosis not present

## 2021-09-27 DIAGNOSIS — Z299 Encounter for prophylactic measures, unspecified: Secondary | ICD-10-CM | POA: Diagnosis not present

## 2021-09-27 DIAGNOSIS — R0789 Other chest pain: Secondary | ICD-10-CM | POA: Diagnosis not present

## 2021-09-27 DIAGNOSIS — I10 Essential (primary) hypertension: Secondary | ICD-10-CM | POA: Diagnosis not present

## 2021-09-27 DIAGNOSIS — E1165 Type 2 diabetes mellitus with hyperglycemia: Secondary | ICD-10-CM | POA: Diagnosis not present

## 2021-09-27 DIAGNOSIS — I25119 Atherosclerotic heart disease of native coronary artery with unspecified angina pectoris: Secondary | ICD-10-CM | POA: Diagnosis not present

## 2021-09-27 DIAGNOSIS — F17210 Nicotine dependence, cigarettes, uncomplicated: Secondary | ICD-10-CM | POA: Diagnosis not present

## 2021-10-05 DIAGNOSIS — E1165 Type 2 diabetes mellitus with hyperglycemia: Secondary | ICD-10-CM | POA: Diagnosis not present

## 2021-10-05 DIAGNOSIS — I10 Essential (primary) hypertension: Secondary | ICD-10-CM | POA: Diagnosis not present

## 2021-10-05 DIAGNOSIS — Z299 Encounter for prophylactic measures, unspecified: Secondary | ICD-10-CM | POA: Diagnosis not present

## 2021-10-05 DIAGNOSIS — J449 Chronic obstructive pulmonary disease, unspecified: Secondary | ICD-10-CM | POA: Diagnosis not present

## 2021-10-05 DIAGNOSIS — F17210 Nicotine dependence, cigarettes, uncomplicated: Secondary | ICD-10-CM | POA: Diagnosis not present

## 2021-10-09 NOTE — Progress Notes (Signed)
Cardiology Office Note    Date:  10/17/2021   ID:  Brice, Kossman 1941-07-04, MRN [id number]  PCP:  Monico Blitz, MD  Cardiologist: Jenkins Rouge, MD    No chief complaint on file.   History of Present Illness:    Richard Rocha is a 81 y.o. male with past medical history of CAD (s/p Coronary CT in 03/2020 showing LAD stenosis and cath recommended --> DES to mid-LAD with residual 30% ostial LAD stenosis and 30% distal LAD stenosis), HTN, HLD, Type 2 DM and tobacco use who presents to the office today for F/U   Lung cancer screening Chest CT 04/20/21 which showed emphysema suggestive of COPD but no suspicious lesions.  By review of Care Everywhere, he did go to [facility] on 09/11/2021 for evaluation of chest pain which had started earlier that night. His Troponin values were negative and it appears he left AMA prior to discharge. 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 Has some dyspnea on exertion. No orthopnea, PND or pitting edema. Also seen in [facility] ED 09/19/21 and r/o sent home   Myovue done 09/21/21 low risk  Overall low risk study.  There is an inferior/inferoseptal defect in the setting of diaphragmatic attenuation, cannot exclude prior infarct scar and also a minor inferior apical ischemic territory.  LVEF 63% with normal wall motion.  Started on imdur 09/25/21   No further issues Thinks pain was from spicy food that he likes and helped with tums/pepcid   Past Medical History:  Diagnosis Date   CAD (coronary artery disease)    a. s/p DES to mid-LAD in 03/2020   Diabetes mellitus type II    Dyslipidemia    Essential hypertension    Gastritis and duodenitis    EGD 03/2012   Hiatal hernia    Schatzki's ring    Tobacco abuse     Past Surgical History:  Procedure Laterality Date   CARDIAC CATHETERIZATION     CHOLECYSTECTOMY     COLONOSCOPY     COLONOSCOPY N/A 09/18/2018    Procedure: COLONOSCOPY;  Surgeon: [name], MD;  Location: [facility];  Service: Endoscopy;  Laterality: N/A;  8:30am   CORONARY STENT INTERVENTION N/A 03/28/2020   Procedure: CORONARY STENT INTERVENTION;  Surgeon: [name], MD;  Location: [facility] LAB;  Service: Cardiovascular;  Laterality: N/A;   ESOPHAGOGASTRODUODENOSCOPY  03/25/2012   Procedure: ESOPHAGOGASTRODUODENOSCOPY (EGD);  Surgeon: [name], MD;  Location: [facility];  Service: Gastroenterology;  Laterality: N/A;   INGUINAL HERNIA REPAIR     bilateral   INTRAVASCULAR PRESSURE WIRE/FFR STUDY N/A 03/28/2020   Procedure: INTRAVASCULAR PRESSURE WIRE/FFR STUDY;  Surgeon: [name], MD;  Location: [facility];  Service: Cardiovascular;  Laterality: N/A;   JOINT REPLACEMENT     ltknee arthroscopic,rt shoulder arthroscopic   KNEE SURGERY     left arthroscopy   LEFT HEART CATH AND CORONARY ANGIOGRAPHY N/A 03/28/2020   Procedure: LEFT HEART CATH AND CORONARY ANGIOGRAPHY;  Surgeon: [name], MD;  Location: [facility];  Service: Cardiovascular;  Laterality: N/A;   LEFT HEART CATHETERIZATION WITH CORONARY ANGIOGRAM N/A 03/23/2012   Procedure: LEFT HEART CATHETERIZATION WITH CORONARY ANGIOGRAM;  Surgeon: [name], MD;  Location: [facility] CATH LAB;  Service: Cardiovascular;  Laterality: N/A;   POLYPECTOMY  09/18/2018   Procedure: POLYPECTOMY;  Surgeon:  Rourk, Cristopher Estimable, MD;  Location: [facility];  Service: Endoscopy;;  hepatic flexure (CSx2), cecal (CSx1), splenic flexure(CSx2)   SHOULDER SURGERY     right    Current Medications: Outpatient Medications Prior to Visit  Medication Sig Dispense Refill   acetaminophen (TYLENOL) 650 MG CR tablet Take 650-1,300 mg by mouth every 8 (eight) hours as needed for pain.     amLODipine (NORVASC) 5 MG tablet Take 1 tablet (5 mg total) by mouth daily.     aspirin EC 81 MG tablet Take 81 mg by mouth daily.     enalapril (VASOTEC) 10 MG  tablet Take 20 mg by mouth in the morning and at bedtime.      isosorbide mononitrate (IMDUR) 30 MG 24 hr tablet Take 0.5 tablets (15 mg total) by mouth daily. 45 tablet 3   meclizine (ANTIVERT) 25 MG tablet Take 25 mg by mouth 2 (two) times daily as needed for dizziness.      metFORMIN (GLUCOPHAGE) 500 MG tablet Take 500 mg by mouth daily.   5   Multiple Vitamin (MULTIVITAMIN WITH MINERALS) TABS Take 1 tablet by mouth daily. Centrum     nitroGLYCERIN (NITROSTAT) 0.4 MG SL tablet Place 1 tablet (0.4 mg total) under the tongue every 5 (five) minutes as needed for chest pain. (Not to exceed 3 in a 15 minute time frame.  If no relief after second dose, call 911. 25 tablet 3   pantoprazole (PROTONIX) 40 MG tablet Take 40 mg by mouth daily as needed (gerd).     rosuvastatin (CRESTOR) 40 MG tablet TAKE ONE TABLET BY MOUTH DAILY (Patient taking differently: Take 40 mg by mouth daily.) 90 tablet 3   triamcinolone cream (KENALOG) 0.1 % Apply 1 application topically 2 (two) times daily as needed (skin irritation/rash).      No facility-administered medications prior to visit.     Allergies:   Morphine, Morphine and related, Scopolamine, and Scopolamine hbr   Social History   Socioeconomic History   Marital status: Widowed    Spouse name: Not on file   Number of children: 3   Years of education: Not on file   Highest education level: Not on file  Occupational History   Occupation: Truck driving (part time)    Employer: [employer]  Tobacco Use   Smoking status: Every Day    Packs/day: 1.00    Years: 60.00    Pack years: 60.00    Types: Cigarettes    Start date: 08/23/1959   Smokeless tobacco: Never   Tobacco comments:    He has quit many times.  Vaping Use   Vaping Use: Never used  Substance and Sexual Activity   Alcohol use: Yes    Alcohol/week: 1.0 standard drink    Types: 1 Shots of liquor per week    Comment: once in a while   Drug use: No   Sexual activity: Yes    Birth  control/protection: None  Other Topics Concern   Not on file  Social History Narrative   Lives with wife.     Social Determinants of Health   Financial Resource Strain: Not on file  Food Insecurity: Not on file  Transportation Needs: Not on file  Physical Activity: Not on file  Stress: Not on file  Social Connections: Not on file     Family History:  The patient's family history includes Cancer in his father; Coronary artery disease (age of onset: 41) in his brother.  Review of Systems:    Please see the history of present illness.     All other systems reviewed and are otherwise negative except as noted above.   Physical Exam:    VS:  BP 118/70    Pulse 64    Ht 5\' 7"  (1.702 m)    Wt 168 lb (76.2 kg)    SpO2 99%    BMI 26.31 kg/m    Affect appropriate Healthy:  appears stated age 54: normal Neck supple with no adenopathy JVP normal no bruits no thyromegaly Lungs clear with no wheezing and good diaphragmatic motion Heart:  S1/S2 no murmur, no rub, gallop or click PMI normal Abdomen: benighn, BS positve, no tenderness, no AAA no bruit.  No HSM or HJR Distal pulses intact with no bruits No edema Neuro non-focal Skin warm and dry No muscular weakness   Wt Readings from Last 3 Encounters:  10/17/21 168 lb (76.2 kg)  09/19/21 167 lb (75.8 kg)  09/19/21 165 lb (74.8 kg)     Studies/Labs Reviewed:   EKG:  EKG is not ordered today. EKG that was performed in the ED is reviewed and shows sinus bradycardia, HR 58 with no acute ST changes.  Recent Labs: 10/26/2020: ALT 21 09/19/2021: BUN 16; Creatinine, Ser 0.83; Hemoglobin 15.0; Platelets 189; Potassium 4.2; Sodium 138   Lipid Panel    Component Value Date/Time   CHOL 81 10/26/2020 0839   TRIG 119 10/26/2020 0839   HDL 37 (L) 10/26/2020 0839   CHOLHDL 2.2 10/26/2020 0839   VLDL 24 10/26/2020 0839   LDLCALC 20 10/26/2020 0839    Additional studies/ records that were reviewed today include:   Cardiac  Catheterization: 03/2020 Ost LAD to Prox LAD lesion is 30% stenosed. Mid LAD lesion is 70% stenosed. Significant by DFR. Large plaque burden by IVUS. A drug-eluting stent was successfully placed using a STENT RESOLUTE ONYX 3.5X26. Stent optimized with IVUS. Post intervention, there is a 0% residual stenosis. Dist LAD lesion is 30% stenosed. The left ventricular systolic function is normal. LV end diastolic pressure is normal. The left ventricular ejection fraction is 55-65% by visual estimate. There is no aortic valve stenosis.   Continue aggressive secondary prevention including smoking cessation.  Watch overnight.  Plan for discharge tomorrow.    NST: 07/2020 No diagnostic ST segment changes to indicate ischemia. Small, mild intensity, mid to basal inferoseptal/inferior defect that is fixed and most consistent with soft tissue attenuation in light of normal wall motion. No large ischemic territories. This is a low risk study. Nuclear stress EF: 63%.  NST:  09/21/21    Findings are equivocal. The study is low risk.   No ST deviation was noted. The ECG was negative for ischemia.   LV perfusion is abnormal.  Moderate sized, moderate intensity, apical to basal inferior/inferoseptal defect that is predominantly fixed and associated with normal wall motion suggestive of diaphragmatic attenuation, although cannot exclude infarct scar.  There is partial reversibility at the inferior apex suggesting a minor ischemic territory.   Left ventricular function is normal. Nuclear stress EF: 63 %.   Overall low risk study.  There is an inferior/inferoseptal defect in the setting of diaphragmatic attenuation, cannot exclude prior infarct scar and also a minor inferior apical ischemic territory.  LVEF 63% with normal wall motion.   Plan:   In order of problems listed above:  1. Chest Pain with Mixed Features -  low risk myovue 09/21/21 started on Imdur ER visits with  no acute ECG changes and r/o DES  to mid LAD 03/2020 Myovue abnormal in RCA territory which had no dx likely diaphragmatic attenuation Continue medical RX ASA/STatin nitrates no beta blocker with baseline slow HR Plavix d/c post 1 year from DES   2 HTN - His BP is well-controlled at 134/60 during today's visit. Continue current medication regimen with Amlodipine 5mg  daily and Enalapril 20mg  BID.   3. HLD - FLP in 10/2020 showed total cholesterol at 81, triglycerides 119, HDL 37 and LDL 20. Continue Crestor 40mg  daily.   4. GERD:  continue Tums and Pepcid f/u primary    No testing   F/U in 6 months    Medication Adjustments/Labs and Tests Ordered: Current medicines are reviewed at length with the patient today.  Concerns regarding medicines are outlined above.  Medication changes, Labs and Tests ordered today are listed in the Patient Instructions below. There are no Patient Instructions on file for this visit.   Signed, Jenkins Rouge, MD  10/17/2021 10:07 AM    [place] [address] [place], [place] [address] Phone: [phone] Fax: [phone]

## 2021-10-17 ENCOUNTER — Other Ambulatory Visit: Payer: Self-pay

## 2021-10-17 ENCOUNTER — Ambulatory Visit: Payer: Medicare Other | Admitting: Cardiovascular Disease

## 2021-10-17 ENCOUNTER — Encounter: Payer: Self-pay | Admitting: Cardiovascular Disease

## 2021-10-17 VITALS — BP 118/70 | HR 64 | Ht 67.0 in | Wt 168.0 lb

## 2021-10-17 DIAGNOSIS — I10 Essential (primary) hypertension: Secondary | ICD-10-CM | POA: Diagnosis not present

## 2021-10-17 DIAGNOSIS — E785 Hyperlipidemia, unspecified: Secondary | ICD-10-CM | POA: Diagnosis not present

## 2021-10-17 DIAGNOSIS — R079 Chest pain, unspecified: Secondary | ICD-10-CM

## 2021-10-17 DIAGNOSIS — I25118 Atherosclerotic heart disease of native coronary artery with other forms of angina pectoris: Secondary | ICD-10-CM | POA: Diagnosis not present

## 2021-10-17 NOTE — Patient Instructions (Signed)
Medication Instructions:  Your physician recommends that you continue on your current medications as directed. Please refer to the Current Medication list given to you today.  *If you need a refill on your cardiac medications before your next appointment, please call your pharmacy*   Lab Work: NONE  If you have labs (blood work) drawn today and your tests are completely normal, you will receive your results only by: MyChart Message (if you have MyChart) OR A paper copy in the mail If you have any lab test that is abnormal or we need to change your treatment, we will call you to review the results.   Testing/Procedures: NONE    Follow-Up: At [facility], you and your health needs are our priority.  As part of our continuing mission to provide you with exceptional heart care, we have created designated Provider Care Teams.  These Care Teams include your primary Cardiologist (physician) and Advanced Practice Providers (APPs -  Physician Assistants and Nurse Practitioners) who all work together to provide you with the care you need, when you need it.  We recommend signing up for the patient portal called "MyChart".  Sign up information is provided on this After Visit Summary.  MyChart is used to connect with patients for Virtual Visits (Telemedicine).  Patients are able to view lab/test results, encounter notes, upcoming appointments, etc.  Non-urgent messages can be sent to your provider as well.   To learn more about what you can do with MyChart, go to [email or web address].    Your next appointment:   6 month(s)  The format for your next appointment:   In Person  Provider:   Peter Nishan, MD   Other Instructions Thank you for choosing [facility]!    

## 2021-12-03 DIAGNOSIS — E7800 Pure hypercholesterolemia, unspecified: Secondary | ICD-10-CM | POA: Diagnosis not present

## 2021-12-03 DIAGNOSIS — Z7189 Other specified counseling: Secondary | ICD-10-CM | POA: Diagnosis not present

## 2021-12-03 DIAGNOSIS — D692 Other nonthrombocytopenic purpura: Secondary | ICD-10-CM | POA: Diagnosis not present

## 2021-12-03 DIAGNOSIS — Z0000 Encounter for general adult medical examination without abnormal findings: Secondary | ICD-10-CM | POA: Diagnosis not present

## 2021-12-03 DIAGNOSIS — I10 Essential (primary) hypertension: Secondary | ICD-10-CM | POA: Diagnosis not present

## 2021-12-03 DIAGNOSIS — Z299 Encounter for prophylactic measures, unspecified: Secondary | ICD-10-CM | POA: Diagnosis not present

## 2021-12-04 DIAGNOSIS — R5383 Other fatigue: Secondary | ICD-10-CM | POA: Diagnosis not present

## 2021-12-04 DIAGNOSIS — Z79899 Other long term (current) drug therapy: Secondary | ICD-10-CM | POA: Diagnosis not present

## 2021-12-04 DIAGNOSIS — E7800 Pure hypercholesterolemia, unspecified: Secondary | ICD-10-CM | POA: Diagnosis not present

## 2021-12-11 ENCOUNTER — Telehealth: Payer: Self-pay | Admitting: Cardiovascular Disease

## 2021-12-11 MED ORDER — ISOSORBIDE MONONITRATE ER 30 MG PO TB24: 30.0000 mg | ORAL_TABLET | Freq: Every day | ORAL | 3 refills | Status: DC

## 2021-12-11 NOTE — Telephone Encounter (Signed)
Pt c/o medication issue: ? ?1. Name of Medication:  ? isosorbide mononitrate (IMDUR) 30 MG 24 hr tablet  ? ? ?2. How are you currently taking this medication (dosage and times per day)? Take 0.5 tablets (15 mg total) by mouth daily. ? ?3. Are you having a reaction (difficulty breathing--STAT)? No ? ?4. What is your medication issue? Pharmacy states that they need a new prescription. Pt told them that prescription was changed to 1 tablet by mouth daily. Please advise ?

## 2021-12-11 NOTE — Telephone Encounter (Signed)
Medication refill for Imdur 30 mg tablets sent to pharmacy  ?

## 2021-12-11 NOTE — Telephone Encounter (Signed)
? ? ?  Yes, he was suppose to start at '15mg'$  daily and titrate to '30mg'$  daily as long as he tolerated it well. Can provide Rx for 90-day supplies of Imdur '30mg'$  daily.  ? ?Signed, ?Erma Heritage, PA-C ?12/11/2021, 12:29 PM ?Pager: [phone] ? ?

## 2021-12-12 IMAGING — CT CT CHEST LUNG CANCER SCREENING LOW DOSE W/O CM
2 of 3 series · 15 of 36 positions shown, 18 images · non-contrast
Comparison: Cardiac CT 03/17/2020.

CLINICAL DATA: 79-year-old male current smoker with 60 pack-year
history of smoking. Lung cancer screening examination.

EXAM:
CT CHEST WITHOUT CONTRAST LOW-DOSE FOR LUNG CANCER SCREENING
TECHNIQUE: Multidetector CT imaging of the chest was performed following the
standard protocol without IV contrast.

[Series 2: axial st · axial · 0.71mm/px · z∈[+1352,+1612]mm · 12 of 62 slices shown, 15 images]
[im 5/62  mediastinal]
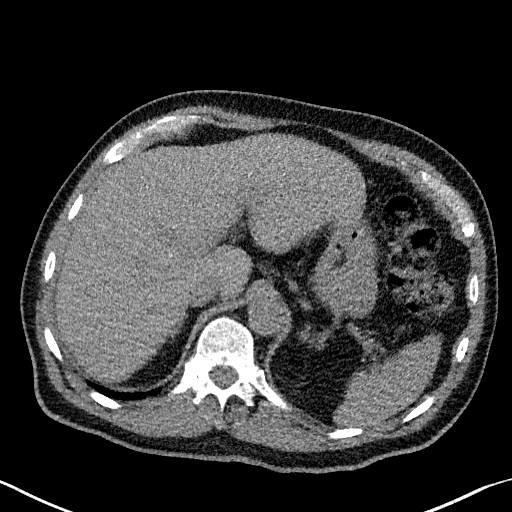
[im 5/62  lung]
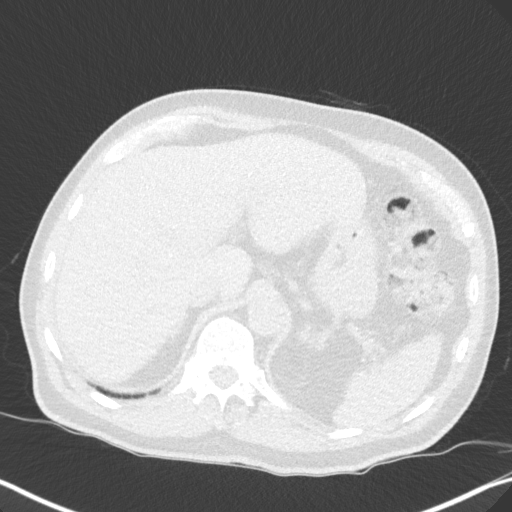
[im 10/62  lung]
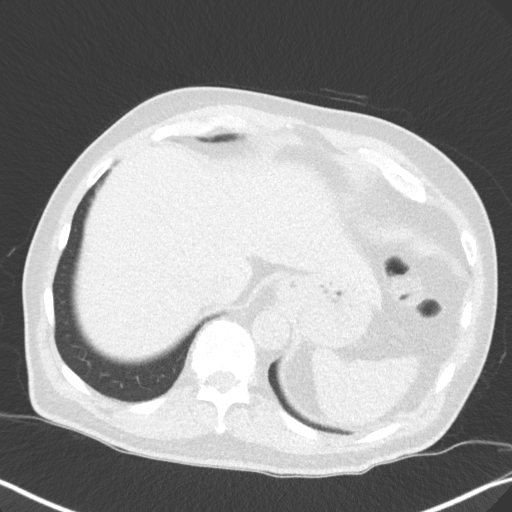
[im 14/62  lung]
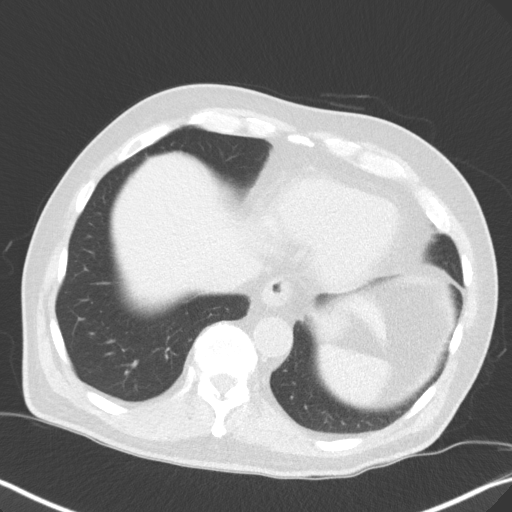
[im 19/62  lung]
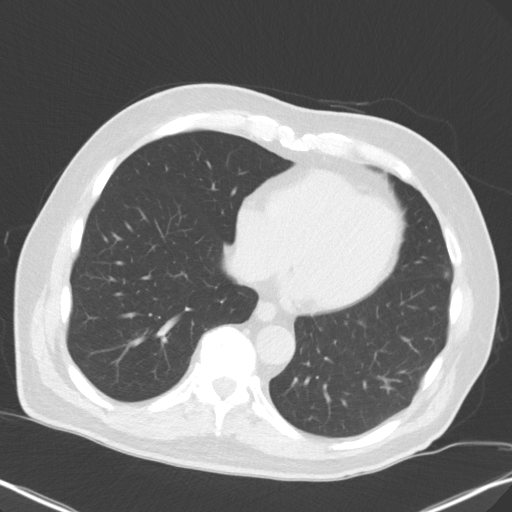
[im 23/62  mediastinal]
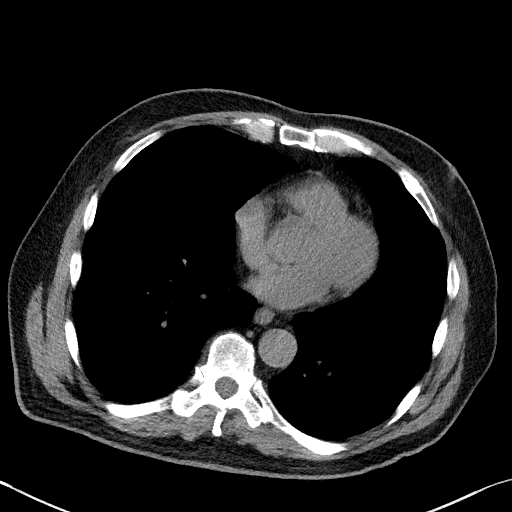
[im 23/62  lung]
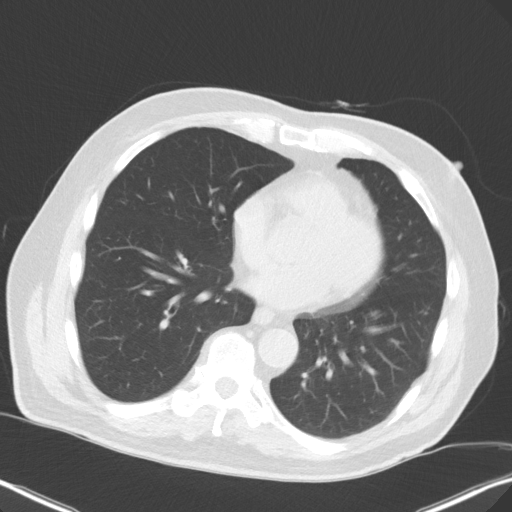
[im 28/62  lung]
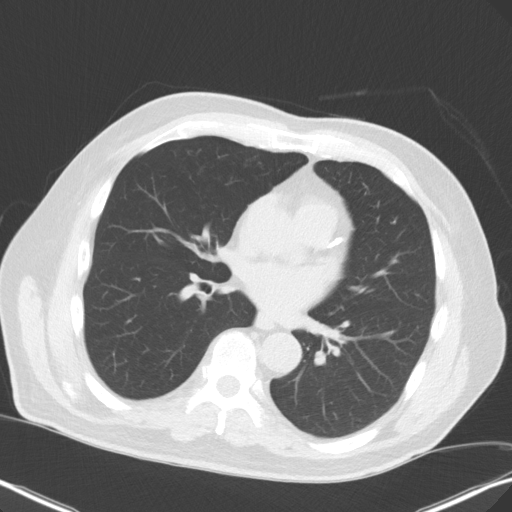
[im 34/62  lung]
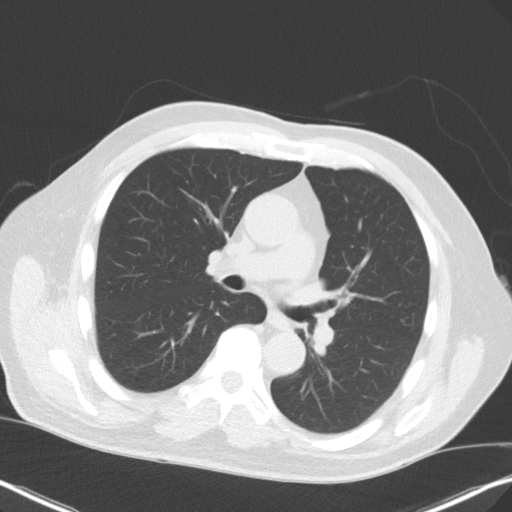
[im 39/62  lung]
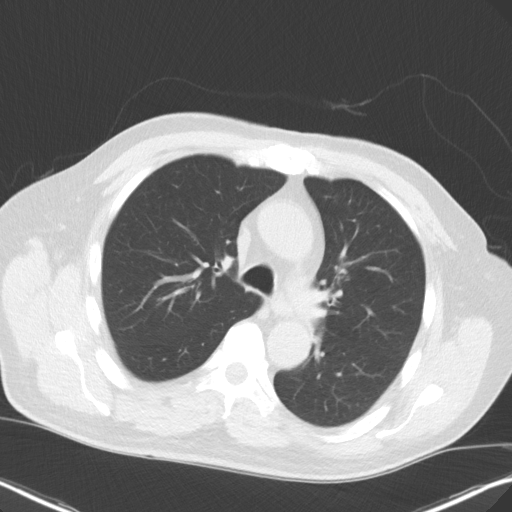
[im 43/62  mediastinal]
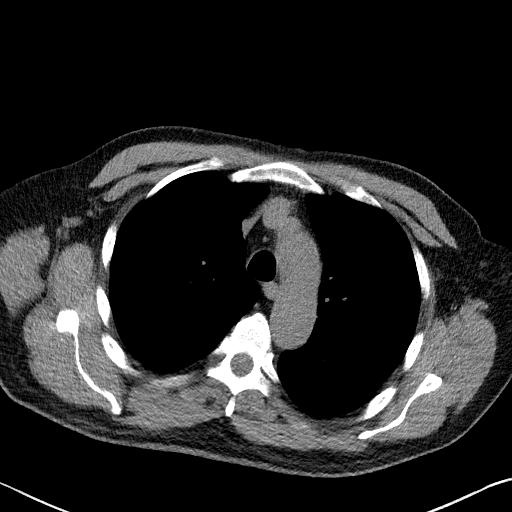
[im 43/62  lung]
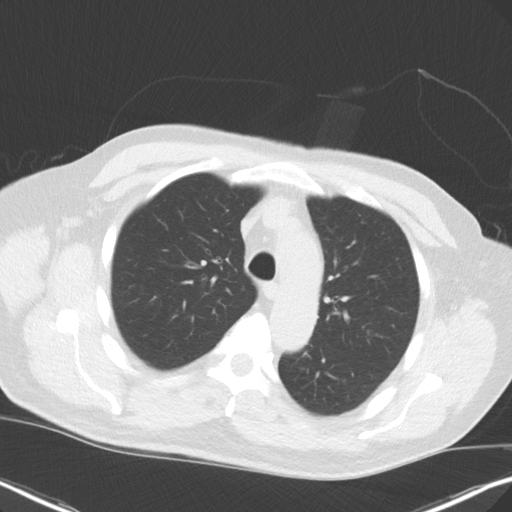
[im 48/62  lung]
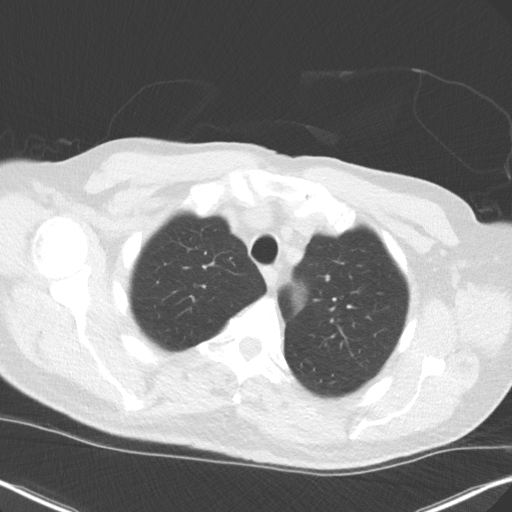
[im 52/62  lung]
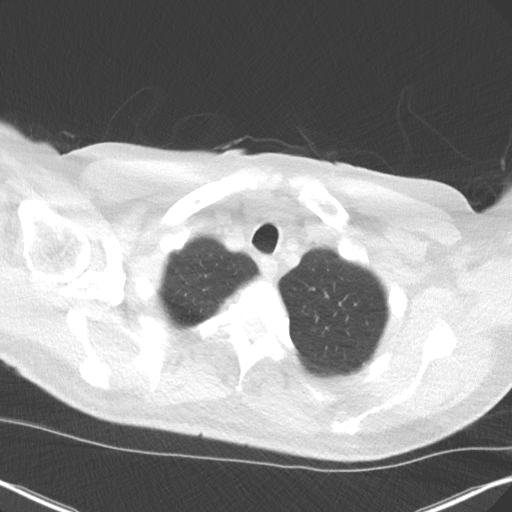
[im 57/62  lung]
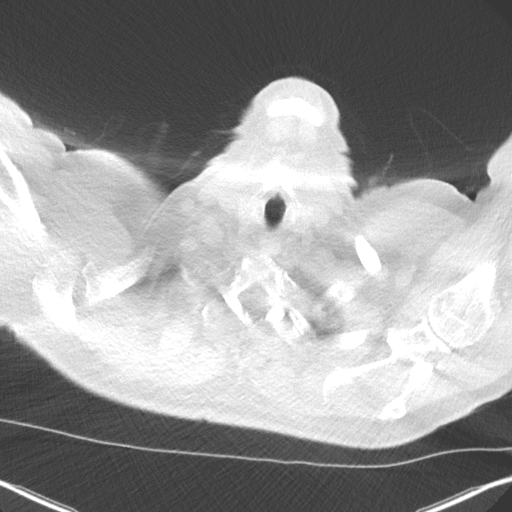

[Series 5: coronal · coronal · 0.62mm/px · 3 of 301 slices shown]
[im 61/301  lung]
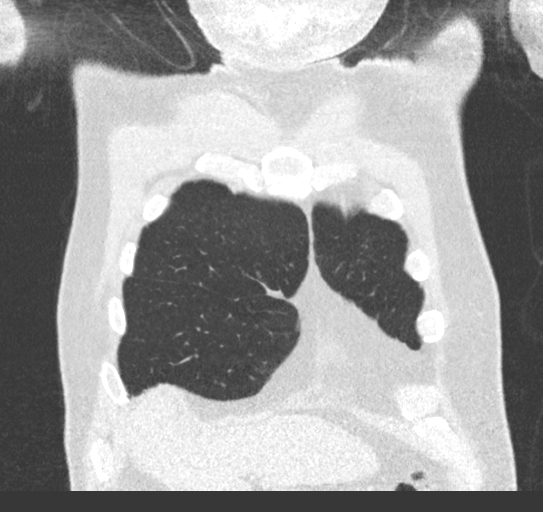
[im 121/301  lung]
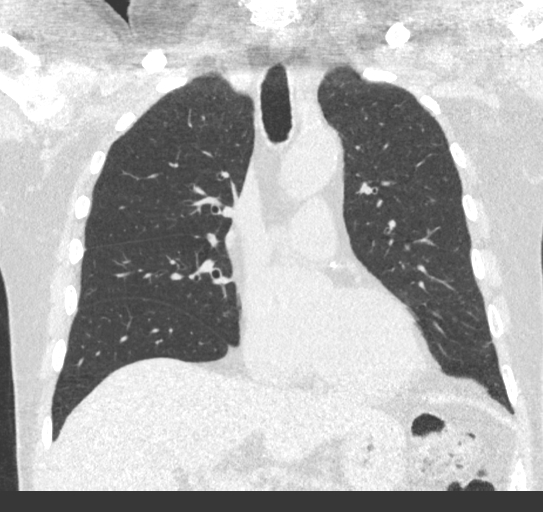
[im 181/301  lung]
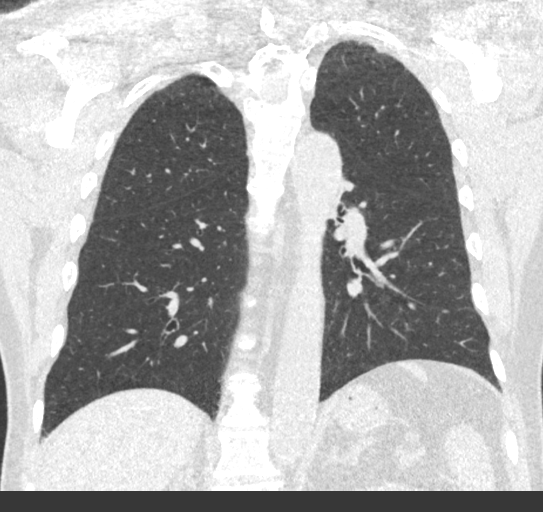

[15 of 36 positions shown; findings below may reference images not displayed]

FINDINGS: Cardiovascular: Heart size is normal. There is no significant
pericardial fluid, thickening or pericardial calcification. There is
aortic atherosclerosis, as well as atherosclerosis of the great
vessels of the mediastinum and the coronary arteries, including
calcified atherosclerotic plaque in the left anterior descending and
left circumflex coronary arteries.

Mediastinum/Nodes: No pathologically enlarged mediastinal or hilar
lymph nodes. Please note that accurate exclusion of hilar adenopathy
is limited on noncontrast CT scans. Esophagus is unremarkable in
appearance. No axillary lymphadenopathy.

Lungs/Pleura: Small calcified granuloma in the right upper lobe. No
other suspicious appearing pulmonary nodules or masses are noted. No
acute consolidative airspace disease. No pleural effusions. Mild
diffuse bronchial wall thickening with mild centrilobular and
paraseptal emphysema.

Upper Abdomen: Aortic atherosclerosis.  Status post cholecystectomy.

Musculoskeletal: There are no aggressive appearing lytic or blastic
lesions noted in the visualized portions of the skeleton.
IMPRESSION: 1. Lung-RADS 1S, negative. Continue annual screening with low-dose
chest CT without contrast in 12 months.
2. The "S" modifier above refers to potentially clinically
significant non lung cancer related findings. Specifically, there is
aortic atherosclerosis, in addition to 2 vessel coronary artery
disease. Please note that although the presence of coronary artery
calcium documents the presence of coronary artery disease, the
severity of this disease and any potential stenosis cannot be
assessed on this non-gated CT examination. Assessment for potential
risk factor modification, dietary therapy or pharmacologic therapy
may be warranted, if clinically indicated.
3. Mild diffuse bronchial wall thickening with mild centrilobular
and paraseptal emphysema; imaging findings suggestive of underlying
COPD.

Aortic Atherosclerosis (A9820-74T.T) and Emphysema (A9820-1UF.W).

## 2022-01-01 ENCOUNTER — Other Ambulatory Visit: Payer: Self-pay | Admitting: Cardiovascular Disease

## 2022-01-08 DIAGNOSIS — Z299 Encounter for prophylactic measures, unspecified: Secondary | ICD-10-CM | POA: Diagnosis not present

## 2022-01-08 DIAGNOSIS — I10 Essential (primary) hypertension: Secondary | ICD-10-CM | POA: Diagnosis not present

## 2022-01-08 DIAGNOSIS — E1165 Type 2 diabetes mellitus with hyperglycemia: Secondary | ICD-10-CM | POA: Diagnosis not present

## 2022-01-08 DIAGNOSIS — E1142 Type 2 diabetes mellitus with diabetic polyneuropathy: Secondary | ICD-10-CM | POA: Diagnosis not present

## 2022-01-08 DIAGNOSIS — F17210 Nicotine dependence, cigarettes, uncomplicated: Secondary | ICD-10-CM | POA: Diagnosis not present

## 2022-01-21 DIAGNOSIS — F17210 Nicotine dependence, cigarettes, uncomplicated: Secondary | ICD-10-CM | POA: Diagnosis not present

## 2022-01-21 DIAGNOSIS — Z299 Encounter for prophylactic measures, unspecified: Secondary | ICD-10-CM | POA: Diagnosis not present

## 2022-01-21 DIAGNOSIS — I10 Essential (primary) hypertension: Secondary | ICD-10-CM | POA: Diagnosis not present

## 2022-01-21 DIAGNOSIS — M25471 Effusion, right ankle: Secondary | ICD-10-CM | POA: Diagnosis not present

## 2022-01-21 DIAGNOSIS — M25472 Effusion, left ankle: Secondary | ICD-10-CM | POA: Diagnosis not present

## 2022-01-28 DIAGNOSIS — I10 Essential (primary) hypertension: Secondary | ICD-10-CM | POA: Diagnosis not present

## 2022-01-28 DIAGNOSIS — Z299 Encounter for prophylactic measures, unspecified: Secondary | ICD-10-CM | POA: Diagnosis not present

## 2022-01-28 DIAGNOSIS — Z713 Dietary counseling and surveillance: Secondary | ICD-10-CM | POA: Diagnosis not present

## 2022-01-28 DIAGNOSIS — M25552 Pain in left hip: Secondary | ICD-10-CM | POA: Diagnosis not present

## 2022-01-29 ENCOUNTER — Ambulatory Visit: Payer: Medicare Other | Admitting: Physician Assistant

## 2022-01-29 NOTE — Progress Notes (Unsigned)
Cardiology Office Note:    Date:  01/29/2022   ID:  Richard Rocha, 1942, MRN [id number]  PCP:  Monico Blitz, MD  [facility] Cardiologist:  Jenkins Rouge, MD  [place] Electrophysiologist:  None   Chief Complaint: Follow up   History of Present Illness:    Richard Rocha is a 81 y.o. male with a hx of CAD, HTN, HLD, Type 2 DM, hiatal hernia, schatzki's ring and tobacco use who presents for follow up.   Hx of CAD s/p Coronary CT in 03/2020 showing LAD stenosis and cath recommended --> DES to mid-LAD with residual 30% ostial LAD stenosis and 30% distal LAD stenosis. Had low risk Myoview 09/2021>> started Imdur for chest pain.   Last seen by Dr. Johnsie Cancel 10/17/21.  Here today for follow up.     Past Medical History:  Diagnosis Date   CAD (coronary artery disease)    a. s/p DES to mid-LAD in 03/2020   Diabetes mellitus type II    Dyslipidemia    Essential hypertension    Gastritis and duodenitis    EGD 03/2012   Hiatal hernia    Schatzki's ring    Tobacco abuse     Past Surgical History:  Procedure Laterality Date   CARDIAC CATHETERIZATION     CHOLECYSTECTOMY     COLONOSCOPY     COLONOSCOPY N/A 09/18/2018   Procedure: COLONOSCOPY;  Surgeon: [name], MD;  [facility];  Service: Endoscopy;  Laterality: N/A;  8:30am   CORONARY STENT INTERVENTION N/A 03/28/2020   Procedure: CORONARY STENT INTERVENTION;  Surgeon: [name], MD;  Location: [place] [facility];  Service: Cardiovascular;  Laterality: N/A;   ESOPHAGOGASTRODUODENOSCOPY  03/25/2012   Procedure: ESOPHAGOGASTRODUODENOSCOPY (EGD);  Surgeon: [name], MD;  Location: [place];  Service: Gastroenterology;  Laterality: N/A;   INGUINAL HERNIA REPAIR     bilateral   INTRAVASCULAR PRESSURE WIRE/FFR STUDY N/A 03/28/2020   Procedure: INTRAVASCULAR PRESSURE WIRE/FFR STUDY;  Surgeon: [name], MD;  Location: [place] [facility];  Service: Cardiovascular;  Laterality:  N/A;   JOINT REPLACEMENT     ltknee arthroscopic,rt shoulder arthroscopic   KNEE SURGERY     left arthroscopy   LEFT HEART CATH AND CORONARY ANGIOGRAPHY N/A 03/28/2020   Procedure: LEFT HEART CATH AND CORONARY ANGIOGRAPHY;  Surgeon: [name], MD;  Location: [place] [facility];  Service: Cardiovascular;  Laterality: N/A;   LEFT HEART CATHETERIZATION WITH CORONARY ANGIOGRAM N/A 03/23/2012   Procedure: LEFT HEART CATHETERIZATION WITH CORONARY ANGIOGRAM;  Surgeon: [name], MD;  Location: [facility] C[facility];  Service: Cardiovascular;  Laterality: N/A;   POLYPECTOMY  09/18/2018   Procedure: POLYPECTOMY;  Surgeon: [name], MD;  [facility];  Service: Endoscopy;;  hepatic flexure (CSx2), cecal (CSx1), splenic flexure(CSx2)   SHOULDER SURGERY     right    Current Medications: No outpatient medications have been marked as taking for the 01/31/22 encounter (Appointment) with Leanor Kail, [place].     Allergies:   Morphine, Morphine and related, Scopolamine, and Scopolamine hbr   Social History   Socioeconomic History   Marital status: Widowed    Spouse name: Not on file   Number of children: 3   Years of education: Not on file   Highest education level: Not on file  Occupational History   Occupation: Truck driving (part time)    Employer: [employer]  Tobacco Use   Smoking status:  Every Day    Packs/day: 1.00    Years: 60.00    Pack years: 60.00    Types: Cigarettes    Start date: 12/Rocha/1960   Smokeless tobacco: Never   Tobacco comments:    He has quit many times.  Vaping Use   Vaping Use: Never used  Substance and Sexual Activity   Alcohol use: Yes    Alcohol/week: 1.0 standard drink    Types: 1 Shots of liquor per week    Comment: once in a while   Drug use: No   Sexual activity: Yes    Birth control/protection: None  Other Topics Concern   Not on file  Social History Narrative   Lives with wife.     Social Determinants of Health    Financial Resource Strain: Not on file  Food Insecurity: Not on file  Transportation Needs: Not on file  Physical Activity: Not on file  Stress: Not on file  Social Connections: Not on file     Family History: The patient's family history includes Cancer in his father; Coronary artery disease (age of onset: 33) in his brother.    ROS:   Please see the history of present illness.    All other systems reviewed and are negative.   EKGs/Labs/Other Studies Reviewed:    The following studies were reviewed today:  Stress test 09/2021 Findings are equivocal. The study is low risk.   No ST deviation was noted. The ECG was negative for ischemia.   LV perfusion is abnormal.  Moderate sized, moderate intensity, apical to basal inferior/inferoseptal defect that is predominantly fixed and associated with normal wall motion suggestive of diaphragmatic attenuation, although cannot exclude infarct scar.  There is partial reversibility at the inferior apex suggesting a minor ischemic territory.   Left ventricular function is normal. Nuclear stress EF: 63 %.   Overall low risk study.  There is an inferior/inferoseptal defect in the setting of diaphragmatic attenuation, cannot exclude prior infarct scar and also a minor inferior apical ischemic territory.  LVEF 63% with normal wall motion.  LEFT HEART CATH AND CORONARY ANGIOGRAPHY  03/2020   Conclusion    Ost LAD to Prox LAD lesion is 30% stenosed. Mid LAD lesion is 70% stenosed. Significant by DFR. Large plaque burden by IVUS. A drug-eluting stent was successfully placed using a STENT RESOLUTE ONYX 3.5X26. Stent optimized with IVUS. Post intervention, there is a 0% residual stenosis. Dist LAD lesion is 30% stenosed. The left ventricular systolic function is normal. LV end diastolic pressure is normal. The left ventricular ejection fraction is 55-65% by visual estimate. There is no aortic valve stenosis.   Continue aggressive secondary  prevention including smoking cessation.  Watch overnight.  Plan for discharge tomorrow.  Diagnostic Dominance: Left    EKG:  EKG is *** ordered today.  The ekg ordered today demonstrates ***  Recent Labs: 09/19/2021: BUN 16; Creatinine, Ser 0.83; Hemoglobin 15.0; Platelets 189; Potassium 4.2; Sodium 138  Recent Lipid Panel    Component Value Date/Time   CHOL 81 10/26/2020 0839   TRIG 119 10/26/2020 0839   HDL 37 (L) 10/26/2020 0839   CHOLHDL 2.2 10/26/2020 0839   VLDL 24 10/26/2020 0839   LDLCALC 20 10/26/2020 0839     Risk Assessment/Calculations:   {Does this patient have ATRIAL FIBRILLATION?[phone]   Physical Exam:    VS:  There were no vitals taken for this visit.    Wt Readings from Last 3 Encounters:  10/17/21 168 lb (  76.2 kg)  09/19/21 167 lb (75.8 kg)  09/19/21 165 lb (74.8 kg)     GEN: *** Well nourished, well developed in no acute distress HEENT: Normal NECK: No JVD; No carotid bruits LYMPHATICS: No lymphadenopathy CARDIAC: ***RRR, no murmurs, rubs, gallops RESPIRATORY:  Clear to auscultation without rales, wheezing or rhonchi  ABDOMEN: Soft, non-tender, non-distended MUSCULOSKELETAL:  No edema; No deformity  SKIN: Warm and dry NEUROLOGIC:  Alert and oriented x 3 PSYCHIATRIC:  Normal affect   ASSESSMENT AND PLAN:    ***  2. ***  Medication Adjustments/Labs and Tests Ordered: Current medicines are reviewed at length with the patient today.  Concerns regarding medicines are outlined above.  No orders of the defined types were placed in this encounter.  No orders of the defined types were placed in this encounter.   There are no Patient Instructions on file for this visit.   Jarrett Soho, [place]  01/29/2022 4:47 PM    [facility]

## 2022-01-29 NOTE — Progress Notes (Unsigned)
Office Visit    Patient Name: Richard Rocha Date of Encounter: 01/29/2022  Primary Care Provider:  Monico Blitz, MD Primary Cardiologist:  Jenkins Rouge, MD Primary Electrophysiologist: None  Chief Complaint    Richard Rocha is a 81 y.o. male with PMH of CAD (s/p Coronary CT in 03/2020 showing LAD stenosis and cath recommended --> DES to mid-LAD with residual 30% ostial LAD stenosis and 30% distal LAD stenosis), HTN, HLD, Type 2 DM and tobacco use presents today for hypertension follow-up.  Past Medical History    Past Medical History:  Diagnosis Date   CAD (coronary artery disease)    a. s/p DES to mid-LAD in 03/2020   Diabetes mellitus type II    Dyslipidemia    Essential hypertension    Gastritis and duodenitis    EGD 03/2012   Hiatal hernia    Schatzki's ring    Tobacco abuse    Past Surgical History:  Procedure Laterality Date   CARDIAC CATHETERIZATION     CHOLECYSTECTOMY     COLONOSCOPY     COLONOSCOPY N/A 09/18/2018   Procedure: COLONOSCOPY;  Surgeon: [name], MD;  [facility];  Service: Endoscopy;  Laterality: N/A;  8:30am   CORONARY STENT INTERVENTION N/A 03/28/2020   Procedure: CORONARY STENT INTERVENTION;  Surgeon: [name], MD;  Location: [place] [facility];  Service: Cardiovascular;  Laterality: N/A;   ESOPHAGOGASTRODUODENOSCOPY  03/25/2012   Procedure: ESOPHAGOGASTRODUODENOSCOPY (EGD);  Surgeon: [name], MD;  Location: [place];  Service: Gastroenterology;  Laterality: N/A;   INGUINAL HERNIA REPAIR     bilateral   INTRAVASCULAR PRESSURE WIRE/FFR STUDY N/A 03/28/2020   Procedure: INTRAVASCULAR PRESSURE WIRE/FFR STUDY;  Surgeon: [name], MD;  Location: [place] [facility];  Service: Cardiovascular;  Laterality: N/A;   JOINT REPLACEMENT     ltknee arthroscopic,rt shoulder arthroscopic   KNEE SURGERY     left arthroscopy   LEFT HEART CATH AND CORONARY ANGIOGRAPHY N/A 03/28/2020   Procedure: LEFT HEART CATH  AND CORONARY ANGIOGRAPHY;  Surgeon: [name], MD;  Location: [place] [facility];  Service: Cardiovascular;  Laterality: N/A;   LEFT HEART CATHETERIZATION WITH CORONARY ANGIOGRAM N/A 03/23/2012   Procedure: LEFT HEART CATHETERIZATION WITH CORONARY ANGIOGRAM;  Surgeon: [name], MD;  Location: [facility];  Service: Cardiovascular;  Laterality: N/A;   POLYPECTOMY  09/18/2018   Procedure: POLYPECTOMY;  Surgeon: [name], MD;  [facility];  Service: Endoscopy;;  hepatic flexure (CSx2), cecal (CSx1), splenic flexure(CSx2)   SHOULDER SURGERY     right    Allergies  Allergies  Allergen Reactions   Morphine     Other reaction(s): Other (See Comments) hallucinations   Morphine And Related Other (See Comments)    hallucinations   Scopolamine     Other reaction(s): Other (See Comments) Body shuts down   Scopolamine Hbr Other (See Comments)    Body shuts down    History of Present Illness    Richard Rocha has a PMH of CAD (s/p Coronary CT in 03/2020 showing LAD stenosis and cath recommended --> DES to mid-LAD with residual 30% ostial LAD stenosis and 30% distal LAD stenosis), HTN, HLD, Type 2 DM and tobacco use.  He was initially seen by Dr. Percival Spanish in 03/2012 for complaint of chest pain. 03/2012 LHC completed  by Dr. Johnsie Cancel revealing mild nonobstructive CAD with 30% LAD and LVEF of 65%.  He was  then seen by Dr. Bronson Ing in 2016 with complaint of dizziness and palpitations.  EKG was completed revealing nonspecific T wave abnormalities in aVL and heart rate of 55 with sinus arrhythmia noted.  Patient wore 30-day event monitor that demonstrated sinus rhythm with sinus arrhythmia with no bradycardia.  He was seen in office on 02/2020 by Dr Johnsie Cancel following complaint of atypical chest pain that required 2 nitroglycerin that he states typically awakes him at night.  Cardiac CTA with FFR was ordered that revealed obstructive disease in distal LAD.  Left heart cath was  completed with DES placed at 70% mid LAD lesion.  Richard Rocha was seen in office by Bernerd Pho, [place] on 04/2020 for post cath follow-up.  During visit patient denied any recent chest pain or SOB.  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  Most recently he presented to [facility] ED on 09/2021 with complaint of chest pain that was relieved by nitroglycerine and typically start at night.  Troponin values were negative with no acute EKG changes. and patient left AMA prior to discharge.  Myoview was completed to rule out ischemia that revealed  low risk study with minor inferior apical ischemic territory.  Patient was started on Imdur for anginal pain with plan for cardiac cath if symptoms continue to occur.   Since last being seen in the office patient reports***.  Patient denies chest pain, palpitations, dyspnea, PND, orthopnea, nausea, vomiting, dizziness, syncope, edema, weight gain, or early satiety.     ***Notes: -Anginal symptoms with Imdur -Current GDMT with Crestor 40 mg, aspirin 81 mg, Imdur 30 mg, not on beta-blocker due to sinus bradycardia  Home Medications    Current Outpatient Medications  Medication Sig Dispense Refill   acetaminophen (TYLENOL) 650 MG CR tablet Take 650-1,300 mg by mouth every 8 (eight) hours as needed for pain.     amLODipine (NORVASC) 5 MG tablet Take 1 tablet (5 mg total) by mouth daily.     aspirin EC 81 MG tablet Take 81 mg by mouth daily.     enalapril (VASOTEC) 10 MG tablet Take 20 mg by mouth in the morning and at bedtime.      isosorbide mononitrate (IMDUR) 30 MG 24 hr tablet Take 1 tablet (30 mg total) by mouth daily. 90 tablet 3   meclizine (ANTIVERT) 25 MG tablet Take 25 mg by mouth 2 (two) times daily as needed for dizziness.      metFORMIN (GLUCOPHAGE) 500 MG tablet Take 500 mg by mouth daily.   5   Multiple Vitamin (MULTIVITAMIN WITH MINERALS) TABS Take 1 tablet by mouth daily. Centrum     nitroGLYCERIN  (NITROSTAT) 0.4 MG SL tablet Place 1 tablet (0.4 mg total) under the tongue every 5 (five) minutes as needed for chest pain. (Not to exceed 3 in a 15 minute time frame.  If no relief after second dose, call 911. 25 tablet 3   pantoprazole (PROTONIX) 40 MG tablet Take 40 mg by mouth daily as needed (gerd).     rosuvastatin (CRESTOR) 40 MG tablet TAKE ONE TABLET BY MOUTH DAILY 90 tablet 1   triamcinolone cream (KENALOG) 0.1 % Apply 1 application topically 2 (two) times daily as needed (skin irritation/rash).      No current facility-administered medications for this visit.     Review of Systems  Please see the history of present illness.    (+)*** (+)***  All other systems reviewed and are otherwise negative  except as noted above.  Physical Exam    Wt Readings from Last 3 Encounters:  10/17/21 168 lb (76.2 kg)  09/19/21 167 lb (75.8 kg)  09/19/21 165 lb (74.8 kg)   OI:BBCWU were no vitals filed for this visit.,There is no height or weight on file to calculate BMI.  Constitutional:      Appearance: Healthy appearance. Not in distress.  Neck:     Vascular: JVD normal.  Pulmonary:     Effort: Pulmonary effort is normal.     Breath sounds: No wheezing. No rales. Diminished in the bases Cardiovascular:     Normal rate. Regular rhythm. Normal S1. Normal S2.      Murmurs: There is no murmur.  Edema:    Peripheral edema absent.  Abdominal:     Palpations: Abdomen is soft non tender. There is no hepatomegaly.  Skin:    General: Skin is warm and dry.  Neurological:     General: No focal deficit present.     Mental Status: Alert and oriented to person, place and time.     Cranial Nerves: Cranial nerves are intact.  EKG/LABS/Other Studies Reviewed    ECG personally reviewed by me today - ***  Risk Assessment/Calculations:   {Does this patient have ATRIAL FIBRILLATION?[phone]}        Lab Results  Component Value Date   WBC 7.0 09/19/2021   HGB 15.0 09/19/2021   HCT 45.4  09/19/2021   MCV 93.2 09/19/2021   PLT 189 09/19/2021   Lab Results  Component Value Date   CREATININE 0.83 09/19/2021   BUN 16 09/19/2021   NA 138 09/19/2021   K 4.2 09/19/2021   CL 104 09/19/2021   CO2 25 09/19/2021   Lab Results  Component Value Date   ALT 21 10/26/2020   AST 22 10/26/2020   ALKPHOS 46 10/26/2020   BILITOT 0.5 10/26/2020   Lab Results  Component Value Date   CHOL 81 10/26/2020   HDL 37 (L) 10/26/2020   LDLCALC 20 10/26/2020   TRIG 119 10/26/2020   CHOLHDL 2.2 10/26/2020    No results found for: HGBA1C  Assessment & Plan    1.  Nonobstructive CAD:  2.  Hypertension:  3.  Hyperlipidemia:  4.  Tobacco abuse:      Disposition: Follow-up with Jenkins Rouge, MD or APP in *** months {Are you ordering a CV Procedure (e.g. stress test, cath, DCCV, TEE, etc)?   Press F2        :[id number]}   Medication Adjustments/Labs and Tests Ordered: Current medicines are reviewed at length with the patient today.  Concerns regarding medicines are outlined above.   Signed, Mable Fill, Marissa Nestle, NP 01/29/2022, 9:02 AM [place]

## 2022-01-31 ENCOUNTER — Ambulatory Visit: Payer: Medicare Other | Admitting: Physician Assistant

## 2022-01-31 DIAGNOSIS — R079 Chest pain, unspecified: Secondary | ICD-10-CM | POA: Diagnosis not present

## 2022-01-31 DIAGNOSIS — Z888 Allergy status to other drugs, medicaments and biological substances status: Secondary | ICD-10-CM | POA: Diagnosis not present

## 2022-01-31 DIAGNOSIS — E785 Hyperlipidemia, unspecified: Secondary | ICD-10-CM | POA: Diagnosis not present

## 2022-01-31 DIAGNOSIS — I10 Essential (primary) hypertension: Secondary | ICD-10-CM | POA: Diagnosis not present

## 2022-01-31 DIAGNOSIS — Z720 Tobacco use: Secondary | ICD-10-CM | POA: Diagnosis not present

## 2022-01-31 DIAGNOSIS — Z7982 Long term (current) use of aspirin: Secondary | ICD-10-CM | POA: Diagnosis not present

## 2022-01-31 DIAGNOSIS — Z79899 Other long term (current) drug therapy: Secondary | ICD-10-CM | POA: Diagnosis not present

## 2022-01-31 DIAGNOSIS — Z7902 Long term (current) use of antithrombotics/antiplatelets: Secondary | ICD-10-CM | POA: Diagnosis not present

## 2022-01-31 DIAGNOSIS — E119 Type 2 diabetes mellitus without complications: Secondary | ICD-10-CM | POA: Diagnosis not present

## 2022-01-31 DIAGNOSIS — Z885 Allergy status to narcotic agent status: Secondary | ICD-10-CM | POA: Diagnosis not present

## 2022-01-31 DIAGNOSIS — Z87891 Personal history of nicotine dependence: Secondary | ICD-10-CM | POA: Diagnosis not present

## 2022-02-04 NOTE — Progress Notes (Unsigned)
Cardiology Office Note    Date:  02/06/2022   ID:  Richard, Rocha 01/16/1941, MRN [id number]  PCP:  Monico Blitz, MD  Cardiologist:  Jenkins Rouge, MD  Electrophysiologist:  None   Chief Complaint: f/u chest pain  History of Present Illness:   Richard Rocha is a 81 y.o. male with history of CAD s/p DES to LAD 03/2020, HTN, HLD, DM, tobacco abuse, mild emphysema/COPD by screening chest CT 04/2021, gastritis/duodenitis, hiatal hernia, Schatzki's ring who is seen for follow-up. His last cath was at time of PCI in 03/2020 with residual nonobstructive disease managed medically; EF 55-60%. He had a stress test 09/2021 for chest pain with mixed features which was overall low risk. There was an inferior/inferoseptal defect in the setting of diaphragmatic attenuation, cannot exclude prior infarct scar and also a minor inferior apical ischemic territory, EF 63% -> Dr. Johnsie Cancel recommended medical management. Plavix discontinued at last OV 10/2021 since >1 year from DES. He was seen in an outside ED 01/31/22 with chest pain and elevated blood pressure. 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 It was associated with mild dyspnea but no n/v, palpitations or sweating. Symptoms resolved on their own but due to elevated BP he sought care. Troponins were negative and he was able to be discharged home. He has not had any chest pain like this since; otherwise states this tends to happen about once a month, not specifically associated with exertion. He also gets "indigestion" 2-3x/week relieved with Tums. He feels well today without complaint. Unfortunately the list of medicines he brings in today is somewhat different than our list - has simvastatin, lisinopril, not rosuvastatin or enalapril, and he is also not sure if he is taking Imdur. He lives alone. His wife died 4 years ago and his dog died about a year after that.    Labwork  independently reviewed: 01/2022 hsTroponin negx2, Hgb 15.5, plt 188, CMET wnl K 3.9, Cr 0.96 KPN 01/2022 A1C 7.3 KPN 11/2021 Tchol 97, HDL 47, LDL 32, trig 91, TSH wnl 10/2020 LDL 20  Cardiology Studies:   Studies reviewed are outlined and summarized above. Reports included below if pertinent.   Cath 03/2020 Ost LAD to Prox LAD lesion is 30% stenosed. Mid LAD lesion is 70% stenosed. Significant by DFR. Large plaque burden by IVUS. A drug-eluting stent was successfully placed using a STENT RESOLUTE ONYX 3.5X26. Stent optimized with IVUS. Post intervention, there is a 0% residual stenosis. Dist LAD lesion is 30% stenosed. The left ventricular systolic function is normal. LV end diastolic pressure is normal. The left ventricular ejection fraction is 55-65% by visual estimate. There is no aortic valve stenosis.   Continue aggressive secondary prevention including smoking cessation.  Watch overnight.  Plan for discharge tomorrow.  NST 09/2021   Findings are equivocal. The study is low risk.   No ST deviation was noted. The ECG was negative for ischemia.   LV perfusion is abnormal.  Moderate sized, moderate intensity, apical to basal inferior/inferoseptal defect that is predominantly fixed and associated with normal wall motion suggestive of diaphragmatic attenuation, although cannot exclude infarct scar.  There is partial reversibility at the inferior apex suggesting a minor ischemic territory.   Left ventricular function is normal. Nuclear stress EF: 63 %.   Overall low risk study.  There is an inferior/inferoseptal defect in the setting of diaphragmatic attenuation, cannot exclude prior  infarct scar and also a minor inferior apical ischemic territory.  LVEF 63% with normal wall motion.      Past Medical History:  Diagnosis Date   CAD (coronary artery disease)    a. s/p DES to mid-LAD in 03/2020   Diabetes mellitus type II    Dyslipidemia    Essential hypertension    Gastritis and  duodenitis    EGD 03/2012   Hiatal hernia    Schatzki's ring    Tobacco abuse     Past Surgical History:  Procedure Laterality Date   CARDIAC CATHETERIZATION     CHOLECYSTECTOMY     COLONOSCOPY     COLONOSCOPY N/A 09/18/2018   Procedure: COLONOSCOPY;  Surgeon: [name], MD;  [facility];  Service: Endoscopy;  Laterality: N/A;  8:30am   CORONARY STENT INTERVENTION N/A 03/28/2020   Procedure: CORONARY STENT INTERVENTION;  Surgeon: [name], MD;  Location: [facility];  Service: Cardiovascular;  Laterality: N/A;   ESOPHAGOGASTRODUODENOSCOPY  03/25/2012   Procedure: ESOPHAGOGASTRODUODENOSCOPY (EGD);  Surgeon: [name], MD;  Location: [facility];  Service: Gastroenterology;  Laterality: N/A;   INGUINAL HERNIA REPAIR     bilateral   INTRAVASCULAR PRESSURE WIRE/FFR STUDY N/A 03/28/2020   Procedure: INTRAVASCULAR PRESSURE WIRE/FFR STUDY;  Surgeon: [name], MD;  Location: [facility];  Service: Cardiovascular;  Laterality: N/A;   JOINT REPLACEMENT     ltknee arthroscopic,rt shoulder arthroscopic   KNEE SURGERY     left arthroscopy   LEFT HEART CATH AND CORONARY ANGIOGRAPHY N/A 03/28/2020   Procedure: LEFT HEART CATH AND CORONARY ANGIOGRAPHY;  Surgeon: [name], MD;  Location: [facility] [facility];  Service: Cardiovascular;  Laterality: N/A;   LEFT HEART CATHETERIZATION WITH CORONARY ANGIOGRAM N/A 03/23/2012   Procedure: LEFT HEART CATHETERIZATION WITH CORONARY ANGIOGRAM;  Surgeon: [name], MD;  Location: [facility];  Service: Cardiovascular;  Laterality: N/A;   POLYPECTOMY  09/18/2018   Procedure: POLYPECTOMY;  Surgeon: [name], MD;  [facility];  Service: Endoscopy;;  hepatic flexure (CSx2), cecal (CSx1), splenic flexure(CSx2)   SHOULDER SURGERY     right    Current Medications: Current Meds  Medication Sig   acetaminophen (TYLENOL) 650 MG CR tablet Take 650-1,300 mg by mouth every 8 (eight)  hours as needed for pain.   amLODipine (NORVASC) 5 MG tablet Take 1 tablet (5 mg total) by mouth daily.   aspirin EC 81 MG tablet Take 81 mg by mouth daily.   meclizine (ANTIVERT) 25 MG tablet Take 25 mg by mouth 2 (two) times daily as needed for dizziness.    metFORMIN (GLUCOPHAGE) 500 MG tablet Take 500 mg by mouth daily.    Multiple Vitamin (MULTIVITAMIN WITH MINERALS) TABS Take 1 tablet by mouth daily. Centrum   nitroGLYCERIN (NITROSTAT) 0.4 MG SL tablet Place 1 tablet (0.4 mg total) under the tongue every 5 (five) minutes as needed for chest pain. (Not to exceed 3 in a 15 minute time frame.  If no relief after second dose, call 911.   pantoprazole (PROTONIX) 40 MG tablet Take 40 mg by mouth daily as needed (gerd).   simvastatin (ZOCOR) 40 MG tablet Take 40 mg by mouth daily.   triamcinolone cream (KENALOG) 0.1 % Apply 1 application topically 2 (two) times daily as needed (skin irritation/rash).       Allergies:   Morphine, Morphine and related, Scopolamine, and Scopolamine hbr   Social History  Socioeconomic History   Marital status: Widowed    Spouse name: Not on file   Number of children: 3   Years of education: Not on file   Highest education level: Not on file  Occupational History   Occupation: Truck driving (part time)    Employer: [employer]  Tobacco Use   Smoking status: Every Day    Packs/day: 1.00    Years: 60.00    Pack years: 60.00    Types: Cigarettes    Start date: 08/23/1959   Smokeless tobacco: Never   Tobacco comments:    He has quit many times.  Vaping Use   Vaping Use: Never used  Substance and Sexual Activity   Alcohol use: Yes    Alcohol/week: 1.0 standard drink    Types: 1 Shots of liquor per week    Comment: once in a while   Drug use: No   Sexual activity: Yes    Birth control/protection: None  Other Topics Concern   Not on file  Social History Narrative   Lives with wife.     Social Determinants of Health   Financial Resource  Strain: Not on file  Food Insecurity: Not on file  Transportation Needs: Not on file  Physical Activity: Not on file  Stress: Not on file  Social Connections: Not on file     Family History:  The patient's family history includes Cancer in his father; Coronary artery disease (age of onset: 19) in his brother.  ROS:   Please see the history of present illness.  All other systems are reviewed and otherwise negative.    EKG(s)/Additional Labs   EKG:  EKG is ordered today, personally reviewed, demonstrating NSR 61bpm, incomplete RBBB, nonspecific STT changes, similar to prior.  Recent Labs: 09/19/2021: BUN 16; Creatinine, Ser 0.83; Hemoglobin 15.0; Platelets 189; Potassium 4.2; Sodium 138  Recent Lipid Panel    Component Value Date/Time   CHOL 81 10/26/2020 0839   TRIG 119 10/26/2020 0839   HDL 37 (L) 10/26/2020 0839   CHOLHDL 2.2 10/26/2020 0839   VLDL 24 10/26/2020 0839   LDLCALC 20 10/26/2020 0839    PHYSICAL EXAM:    VS:  BP 128/62   Pulse 61   Ht '5\' 7"'$  (1.702 m)   Wt 165 lb 12.8 oz (75.2 kg)   SpO2 96%   BMI 25.97 kg/m   BMI: Body mass index is 25.97 kg/m.  GEN: Well nourished, well developed male in no acute distress HEENT: normocephalic, atraumatic Neck: no JVD, carotid bruits, or masses Cardiac: RRR; no murmurs, rubs, or gallops, no edema  Respiratory:  clear to auscultation bilaterally, normal work of breathing GI: soft, nontender, nondistended, + BS MS: no deformity or atrophy Skin: warm and dry, no rash Neuro:  Alert and Oriented x 3, Strength and sensation are intact, follows commands Psych: euthymic mood, full affect  Wt Readings from Last 3 Encounters:  02/06/22 165 lb 12.8 oz (75.2 kg)  10/17/21 168 lb (76.2 kg)  09/19/21 167 lb (75.8 kg)     ASSESSMENT & PLAN:   1. CAD with unspecified angina pectoris - symptoms reviewed with Dr. Johnsie Cancel; recommend proceeding with cardiac cath for definitive evaluation given ER visit and abnormal nuc 09/2021.  Discussed with patient and he is agreeable. 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 He understands this and says he was likely planning to retire soon anyway. We will  give him a work note to remain out of work until re-evaluated in follow-up. Will also obtain 2D echocardiogram. Pre-cath instructions will include reminder to hold metformin pre-cath. Get repeat BMET, CBC today pre-cath. Of note he is not sure of his home medicine list so he will call us later today when he gets home to read off exactly what he is taking. I would favor restarting Imdur if he is no longer on it for some reason. He does confirm he's taking ASA. ER precautions reviewed.  Shared Decision Making/Informed Consent The risks [stroke (1 in 1000), death (1 in 1000), kidney failure [usually temporary] (1 in 500), bleeding (1 in 200), allergic reaction [possibly serious] (1 in 200)], benefits (diagnostic support and management of coronary artery disease) and alternatives of a cardiac catheterization were discussed in detail with Mr. Winterhalter and he is willing to proceed.   2. Essential HTN - BP currently controlled on present regimen though we aren't quite sure what this is. He will call us with list as above. He does report occasional spikes in his BP to the 150s but is taking his BP right before he takes his blood pressure medicines. We discussed monitoring it about 3 hours after he takes his usual medicines. He is keeping a log to bring to follow-up.  3. Hyperlipidemia - excellent control by [facility] 11/2021 Tchol 97, HDL 47, LDL 32, trig 91. Just need more clarification of what statin he's taking above.  4. History of tobacco abuse - counseled on cessation.    Disposition: F/u with myself or Dr. Johnsie Cancel in 3-4 weeks. Other APP ok if no availability.   Medication Adjustments/Labs and Tests Ordered: Current medicines are reviewed at length with the  patient today.  Concerns regarding medicines are outlined above. Medication changes, Labs and Tests ordered today are summarized above and listed in the Patient Instructions accessible in Encounters.   Signed, Charlie Pitter, PA-C  02/06/2022 8:30 AM    [place] Phone: [phone]; Fax: [phone]

## 2022-02-04 NOTE — H&P (View-Only) (Signed)
Cardiology Office Note    Date:  02/06/2022   ID:  Richard Rocha, Richard Rocha 21-Aug-1941, MRN [id number]  PCP:  Monico Blitz, MD  Cardiologist:  Jenkins Rouge, MD  Electrophysiologist:  None   Chief Complaint: f/u chest pain  History of Present Illness:   Richard Rocha is a 81 y.o. male with history of CAD s/p DES to LAD 03/2020, HTN, HLD, DM, tobacco abuse, mild emphysema/COPD by screening chest CT 04/2021, gastritis/duodenitis, hiatal hernia, Schatzki's ring who is seen for follow-up. His last cath was at time of PCI in 03/2020 with residual nonobstructive disease managed medically; EF 55-60%. He had a stress test 09/2021 for chest pain with mixed features which was overall low risk. There was an inferior/inferoseptal defect in the setting of diaphragmatic attenuation, cannot exclude prior infarct scar and also a minor inferior apical ischemic territory, EF 63% -> Dr. Johnsie Cancel recommended medical management. Plavix discontinued at last OV 10/2021 since >1 year from DES. He was seen in an outside ED 01/31/22 with chest pain and elevated blood pressure. 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 It was associated with mild dyspnea but no n/v, palpitations or sweating. Symptoms resolved on their own but due to elevated BP he sought care. Troponins were negative and he was able to be discharged home. He has not had any chest pain like this since; otherwise states this tends to happen about once a month, not specifically associated with exertion. He also gets "indigestion" 2-3x/week relieved with Tums. He feels well today without complaint. Unfortunately the list of medicines he brings in today is somewhat different than our list - has simvastatin, lisinopril, not rosuvastatin or enalapril, and he is also not sure if he is taking Imdur. He lives alone. His wife died 4 years ago and his dog died about a year after that.    Labwork  independently reviewed: 01/2022 hsTroponin negx2, Hgb 15.5, plt 188, CMET wnl K 3.9, Cr 0.96 KPN 01/2022 A1C 7.3 KPN 11/2021 Tchol 97, HDL 47, LDL 32, trig 91, TSH wnl 10/2020 LDL 20  Cardiology Studies:   Studies reviewed are outlined and summarized above. Reports included below if pertinent.   Cath 03/2020 Ost LAD to Prox LAD lesion is 30% stenosed. Mid LAD lesion is 70% stenosed. Significant by DFR. Large plaque burden by IVUS. A drug-eluting stent was successfully placed using a STENT RESOLUTE ONYX 3.5X26. Stent optimized with IVUS. Post intervention, there is a 0% residual stenosis. Dist LAD lesion is 30% stenosed. The left ventricular systolic function is normal. LV end diastolic pressure is normal. The left ventricular ejection fraction is 55-65% by visual estimate. There is no aortic valve stenosis.   Continue aggressive secondary prevention including smoking cessation.  Watch overnight.  Plan for discharge tomorrow.  NST 09/2021   Findings are equivocal. The study is low risk.   No ST deviation was noted. The ECG was negative for ischemia.   LV perfusion is abnormal.  Moderate sized, moderate intensity, apical to basal inferior/inferoseptal defect that is predominantly fixed and associated with normal wall motion suggestive of diaphragmatic attenuation, although cannot exclude infarct scar.  There is partial reversibility at the inferior apex suggesting a minor ischemic territory.   Left ventricular function is normal. Nuclear stress EF: 63 %.   Overall low risk study.  There is an inferior/inferoseptal defect in the setting of diaphragmatic attenuation, cannot exclude prior  infarct scar and also a minor inferior apical ischemic territory.  LVEF 63% with normal wall motion.      Past Medical History:  Diagnosis Date   CAD (coronary artery disease)    a. s/p DES to mid-LAD in 03/2020   Diabetes mellitus type II    Dyslipidemia    Essential hypertension    Gastritis and  duodenitis    EGD 03/2012   Hiatal hernia    Schatzki's ring    Tobacco abuse     Past Surgical History:  Procedure Laterality Date   CARDIAC CATHETERIZATION     CHOLECYSTECTOMY     COLONOSCOPY     COLONOSCOPY N/A 09/18/2018   Procedure: COLONOSCOPY;  Surgeon: [name], MD;  [facility];  Service: Endoscopy;  Laterality: N/A;  8:30am   CORONARY STENT INTERVENTION N/A 03/28/2020   Procedure: CORONARY STENT INTERVENTION;  Surgeon: [name], MD;  Location: [place] [facility];  Service: Cardiovascular;  Laterality: N/A;   ESOPHAGOGASTRODUODENOSCOPY  03/25/2012   Procedure: ESOPHAGOGASTRODUODENOSCOPY (EGD);  Surgeon: [name], MD;  Location: [place];  Service: Gastroenterology;  Laterality: N/A;   INGUINAL HERNIA REPAIR     bilateral   INTRAVASCULAR PRESSURE WIRE/FFR STUDY N/A 03/28/2020   Procedure: INTRAVASCULAR PRESSURE WIRE/FFR STUDY;  Surgeon: [name], MD;  Location: [place] [facility];  Service: Cardiovascular;  Laterality: N/A;   JOINT REPLACEMENT     ltknee arthroscopic,rt shoulder arthroscopic   KNEE SURGERY     left arthroscopy   LEFT HEART CATH AND CORONARY ANGIOGRAPHY N/A 03/28/2020   Procedure: LEFT HEART CATH AND CORONARY ANGIOGRAPHY;  Surgeon: [name], MD;  Location: [place] [facility];  Service: Cardiovascular;  Laterality: N/A;   LEFT HEART CATHETERIZATION WITH CORONARY ANGIOGRAM N/A 03/23/2012   Procedure: LEFT HEART CATHETERIZATION WITH CORONARY ANGIOGRAM;  Surgeon: [name], MD;  Location: [facility];  Service: Cardiovascular;  Laterality: N/A;   POLYPECTOMY  09/18/2018   Procedure: POLYPECTOMY;  Surgeon: [name], MD;  [facility];  Service: Endoscopy;;  hepatic flexure (CSx2), cecal (CSx1), splenic flexure(CSx2)   SHOULDER SURGERY     right    Current Medications: Current Meds  Medication Sig   acetaminophen (TYLENOL) 650 MG CR tablet Take 650-1,300 mg by mouth every 8 (eight)  hours as needed for pain.   amLODipine (NORVASC) 5 MG tablet Take 1 tablet (5 mg total) by mouth daily.   aspirin EC 81 MG tablet Take 81 mg by mouth daily.   meclizine (ANTIVERT) 25 MG tablet Take 25 mg by mouth 2 (two) times daily as needed for dizziness.    metFORMIN (GLUCOPHAGE) 500 MG tablet Take 500 mg by mouth daily.    Multiple Vitamin (MULTIVITAMIN WITH MINERALS) TABS Take 1 tablet by mouth daily. Centrum   nitroGLYCERIN (NITROSTAT) 0.4 MG SL tablet Place 1 tablet (0.4 mg total) under the tongue every 5 (five) minutes as needed for chest pain. (Not to exceed 3 in a 15 minute time frame.  If no relief after second dose, call 911.   pantoprazole (PROTONIX) 40 MG tablet Take 40 mg by mouth daily as needed (gerd).   simvastatin (ZOCOR) 40 MG tablet Take 40 mg by mouth daily.   triamcinolone cream (KENALOG) 0.1 % Apply 1 application topically 2 (two) times daily as needed (skin irritation/rash).       Allergies:   Morphine, Morphine and related, Scopolamine, and Scopolamine hbr   Social History  Socioeconomic History   Marital status: Widowed    Spouse name: Not on file   Number of children: 3   Years of education: Not on file   Highest education level: Not on file  Occupational History   Occupation: Truck driving (part time)    Employer: [employer]  Tobacco Use   Smoking status: Every Day    Packs/day: 1.00    Years: 60.00    Pack years: 60.00    Types: Cigarettes    Start date: 08/23/1959   Smokeless tobacco: Never   Tobacco comments:    He has quit many times.  Vaping Use   Vaping Use: Never used  Substance and Sexual Activity   Alcohol use: Yes    Alcohol/week: 1.0 standard drink    Types: 1 Shots of liquor per week    Comment: once in a while   Drug use: No   Sexual activity: Yes    Birth control/protection: None  Other Topics Concern   Not on file  Social History Narrative   Lives with wife.     Social Determinants of Health   Financial Resource  Strain: Not on file  Food Insecurity: Not on file  Transportation Needs: Not on file  Physical Activity: Not on file  Stress: Not on file  Social Connections: Not on file     Family History:  The patient's family history includes Cancer in his father; Coronary artery disease (age of onset: 53) in his brother.  ROS:   Please see the history of present illness.  All other systems are reviewed and otherwise negative.    EKG(s)/Additional Labs   EKG:  EKG is ordered today, personally reviewed, demonstrating NSR 61bpm, incomplete RBBB, nonspecific STT changes, similar to prior.  Recent Labs: 09/19/2021: BUN 16; Creatinine, Ser 0.83; Hemoglobin 15.0; Platelets 189; Potassium 4.2; Sodium 138  Recent Lipid Panel    Component Value Date/Time   CHOL 81 10/26/2020 0839   TRIG 119 10/26/2020 0839   HDL 37 (L) 10/26/2020 0839   CHOLHDL 2.2 10/26/2020 0839   VLDL 24 10/26/2020 0839   LDLCALC 20 10/26/2020 0839    PHYSICAL EXAM:    VS:  BP 128/62   Pulse 61   Ht '5\' 7"'$  (1.702 m)   Wt 165 lb 12.8 oz (75.2 kg)   SpO2 96%   BMI 25.97 kg/m   BMI: Body mass index is 25.97 kg/m.  GEN: Well nourished, well developed male in no acute distress HEENT: normocephalic, atraumatic Neck: no JVD, carotid bruits, or masses Cardiac: RRR; no murmurs, rubs, or gallops, no edema  Respiratory:  clear to auscultation bilaterally, normal work of breathing GI: soft, nontender, nondistended, + BS MS: no deformity or atrophy Skin: warm and dry, no rash Neuro:  Alert and Oriented x 3, Strength and sensation are intact, follows commands Psych: euthymic mood, full affect  Wt Readings from Last 3 Encounters:  02/06/22 165 lb 12.8 oz (75.2 kg)  10/17/21 168 lb (76.2 kg)  09/19/21 167 lb (75.8 kg)     ASSESSMENT & PLAN:   1. CAD with unspecified angina pectoris - symptoms reviewed with Dr. Johnsie Cancel; recommend proceeding with cardiac cath for definitive evaluation given ER visit and abnormal nuc 09/2021.  Discussed with patient and he is agreeable. 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 He understands this and says he was likely planning to retire soon anyway. We will  give him a work note to remain out of work until re-evaluated in follow-up. Will also obtain 2D echocardiogram. Pre-cath instructions will include reminder to hold metformin pre-cath. Get repeat BMET, CBC today pre-cath. Of note he is not sure of his home medicine list so he will call us later today when he gets home to read off exactly what he is taking. I would favor restarting Imdur if he is no longer on it for some reason. He does confirm he's taking ASA. ER precautions reviewed.  Shared Decision Making/Informed Consent The risks [stroke (1 in 1000), death (1 in 1000), kidney failure [usually temporary] (1 in 500), bleeding (1 in 200), allergic reaction [possibly serious] (1 in 200)], benefits (diagnostic support and management of coronary artery disease) and alternatives of a cardiac catheterization were discussed in detail with Mr. Bo[place]hine and he is willing to proceed.   2. Essential HTN - BP currently controlled on present regimen though we aren't quite sure what this is. He will call us with list as above. He does report occasional spikes in his BP to the 150s but is taking his BP right before he takes his blood pressure medicines. We discussed monitoring it about 3 hours after he takes his usual medicines. He is keeping a log to bring to follow-up.  3. Hyperlipidemia - excellent control by [facility] 11/2021 Tchol 97, HDL 47, LDL 32, trig 91. Just need more clarification of what statin he's taking above.  4. History of tobacco abuse - counseled on cessation.    Disposition: F/u with myself or Dr. Johnsie Cancel in 3-4 weeks. Other APP ok if no availability.   Medication Adjustments/Labs and Tests Ordered: Current medicines are reviewed at length with the  patient today.  Concerns regarding medicines are outlined above. Medication changes, Labs and Tests ordered today are summarized above and listed in the Patient Instructions accessible in Encounters.   Signed, Charlie Pitter, PA-C  02/06/2022 8:30 AM    [place] Phone: [phone]; Fax: [phone]

## 2022-02-06 ENCOUNTER — Ambulatory Visit: Payer: Medicare Other | Admitting: Physician Assistant

## 2022-02-06 ENCOUNTER — Other Ambulatory Visit: Payer: Self-pay | Admitting: Physician Assistant

## 2022-02-06 ENCOUNTER — Telehealth: Payer: Self-pay | Admitting: Physician Assistant

## 2022-02-06 ENCOUNTER — Encounter: Payer: Self-pay | Admitting: Physician Assistant

## 2022-02-06 VITALS — BP 128/62 | HR 61 | Ht 67.0 in | Wt 165.8 lb

## 2022-02-06 DIAGNOSIS — I25119 Atherosclerotic heart disease of native coronary artery with unspecified angina pectoris: Secondary | ICD-10-CM

## 2022-02-06 DIAGNOSIS — I10 Essential (primary) hypertension: Secondary | ICD-10-CM | POA: Diagnosis not present

## 2022-02-06 DIAGNOSIS — E785 Hyperlipidemia, unspecified: Secondary | ICD-10-CM | POA: Diagnosis not present

## 2022-02-06 DIAGNOSIS — I2510 Atherosclerotic heart disease of native coronary artery without angina pectoris: Secondary | ICD-10-CM

## 2022-02-06 DIAGNOSIS — Z720 Tobacco use: Secondary | ICD-10-CM | POA: Diagnosis not present

## 2022-02-06 LAB — CBC
Hematocrit: 41.3 % (ref 37.5–51.0)
Hemoglobin: 13.7 g/dL (ref 13.0–17.7)
MCH: 29.8 pg (ref 26.6–33.0)
MCHC: 33.2 g/dL (ref 31.5–35.7)
MCV: 90 fL (ref 79–97)
Platelets: 170 10*3/uL (ref 150–450)
RBC: 4.59 x10E6/uL (ref 4.14–5.80)
RDW: 14.3 % (ref 11.6–15.4)
WBC: 8.3 10*3/uL (ref 3.4–10.8)

## 2022-02-06 LAB — BASIC METABOLIC PANEL
BUN/Creatinine Ratio: 19 (ref 10–24)
BUN: 18 mg/dL (ref 8–27)
CO2: 29 mmol/L (ref 20–29)
Calcium: 10.1 mg/dL (ref 8.6–10.2)
Chloride: 100 mmol/L (ref 96–106)
Creatinine, Ser: 0.97 mg/dL (ref 0.76–1.27)
Glucose: 172 mg/dL — ABNORMAL HIGH (ref 70–99)
Potassium: 4.1 mmol/L (ref 3.5–5.2)
Sodium: 140 mmol/L (ref 134–144)
eGFR: 78 mL/min/{1.73_m2} (ref >59)

## 2022-02-06 MED ORDER — SODIUM CHLORIDE 0.9% FLUSH: 3.0000 mL | Freq: Two times a day (BID) | INTRAVENOUS | Status: AC

## 2022-02-06 NOTE — Addendum Note (Signed)
Addended by: Gaetano Net on: 02/06/2022 11:49 AM   Modules accepted: Orders

## 2022-02-06 NOTE — Addendum Note (Signed)
Addended by: Ulice Brilliant T on: 02/06/2022 03:52 PM   Modules accepted: Orders

## 2022-02-06 NOTE — Telephone Encounter (Signed)
   Patient called back to confirm his home medicines and Jeanann Lewandowsky updated medicine list to reflect what he is taking:    Medication Sig Start Date End Date Taking? Authorizing Provider  lisinopril (ZESTRIL) 20 MG tablet Take 20 mg by mouth daily.    [provider]  acetaminophen (TYLENOL) 650 MG CR tablet Take 650-1,300 mg by mouth every 8 (eight) hours as needed for pain.    [provider]  amLODipine (NORVASC) 5 MG tablet Take 1 tablet (5 mg total) by mouth daily. 03/24/20   Strader, Fransisco Hertz, PA-C  aspirin EC 81 MG tablet Take 81 mg by mouth daily.    [provider]  isosorbide mononitrate (IMDUR) 30 MG 24 hr tablet Take 1 tablet (30 mg total) by mouth daily. 12/11/21 03/11/22  Strader, Fransisco Hertz, PA-C  meclizine (ANTIVERT) 25 MG tablet Take 25 mg by mouth 2 (two) times daily as needed for dizziness.     [provider]  metFORMIN (GLUCOPHAGE) 500 MG tablet Take 500 mg by mouth daily.  02/15/15   [provider]  Multiple Vitamin (MULTIVITAMIN WITH MINERALS) TABS Take 1 tablet by mouth daily. Centrum    [provider]  nitroGLYCERIN (NITROSTAT) 0.4 MG SL tablet Place 1 tablet (0.4 mg total) under the tongue every 5 (five) minutes as needed for chest pain. (Not to exceed 3 in a 15 minute time frame.  If no relief after second dose, call 911. 05/10/15   Herminio Commons, MD  pantoprazole (PROTONIX) 40 MG tablet Take 40 mg by mouth daily as needed (gerd). 02/08/20   [provider]  rosuvastatin (CRESTOR) 40 MG tablet TAKE ONE TABLET BY MOUTH DAILY 01/01/22   [name], MD  tamsulosin (FLOMAX) 0.4 MG CAPS capsule Take 0.4 mg by mouth.    [provider]  triamcinolone cream (KENALOG) 0.1 % Apply 1 application topically 2 (two) times daily as needed (skin irritation/rash).  06/17/16   [provider]    Will continue plan as outlined in today's OV.  Everlyn Farabaugh PA-C

## 2022-02-06 NOTE — Patient Instructions (Signed)
Medication Instructions:  PLEASE CALL ME [phone] WHEN YOU GET HOME SO WE CAN GO OVER YOUR MEDICATION *If you need a refill on your cardiac medications before your next appointment, please call your pharmacy*   Lab Work: TODAY-BMET & CBC If you have labs (blood work) drawn today and your tests are completely normal, you will receive your results only by: [place] (if you have MyChart) OR A paper copy in the mail If you have any lab test that is abnormal or we need to change your treatment, we will call you to review the results.   Testing/Procedures: Your physician has requested that you have an echocardiogram. Echocardiography is a painless test that uses sound waves to create images of your heart. It provides your doctor with information about the size and shape of your heart and how well your heart's chambers and valves are working. This procedure takes approximately one hour. There are no restrictions for this procedure.  Your physician has requested that you have a cardiac catheterization. Cardiac catheterization is used to diagnose and/or treat various heart conditions. Doctors may recommend this procedure for a number of different reasons. The most common reason is to evaluate chest pain. Chest pain can be a symptom of coronary artery disease (CAD), and cardiac catheterization can show whether plaque is narrowing or blocking your heart's arteries. This procedure is also used to evaluate the valves, as well as measure the blood flow and oxygen levels in different parts of your heart. For further information please visit [email or web address].[email or web address]. Please follow instruction sheet, as given. SEE INSTRUCTIONS BELOW   Follow-Up: At [facility], you and your health needs are our priority.  As part of our continuing mission to provide you with exceptional heart care, we have created designated Provider Care Teams.  These Care Teams include your primary Cardiologist (physician) and  Advanced Practice Providers (APPs -  Physician Assistants and Nurse Practitioners) who all work together to provide you with the care you need, when you need it.  We recommend signing up for the patient portal called "MyChart".  Sign up information is provided on this After Visit Summary.  MyChart is used to connect with patients for Virtual Visits (Telemedicine).  Patients are able to view lab/test results, encounter notes, upcoming appointments, etc.  Non-urgent messages can be sent to your provider as well.   To learn more about what you can do with MyChart, go to [email or web address].    Your next appointment:   3-4 week(s)  The format for your next appointment:   In Person  Provider:   Melina Copa, PA     Other Instructions [facility] OFFICE [place], [place] [address] Dept: [phone] Loc: [place]  02/06/2022  You are scheduled for a Cardiac Catheterization on Tuesday, June 6 with Dr. Peter Martinique.  1. Please arrive at the Main Entrance A at [facility]: [place], [place] [address] at 8:30 AM (This time is two hours before your procedure to ensure your preparation). Free valet parking service is available.   Special note: Every effort is made to have your procedure done on time. Please understand that emergencies sometimes delay scheduled procedures.  2. Diet: Do not eat solid foods after midnight.  You may have clear liquids until 5 AM upon the day of the procedure.  3. Labs: LABS WILL BE COMPLETED TODAY  4. Medication instructions in  preparation for your procedure:   Contrast Allergy: No   Current Outpatient Medications (Endocrine & Metabolic):    metFORMIN (GLUCOPHAGE) 500 MG tablet, Take 500 mg by mouth daily.   Current Outpatient Medications (Cardiovascular):    amLODipine (NORVASC) 5 MG tablet, Take 1 tablet (5 mg  total) by mouth daily.   nitroGLYCERIN (NITROSTAT) 0.4 MG SL tablet, Place 1 tablet (0.4 mg total) under the tongue every 5 (five) minutes as needed for chest pain. (Not to exceed 3 in a 15 minute time frame.  If no relief after second dose, call 911.   simvastatin (ZOCOR) 40 MG tablet, Take 40 mg by mouth daily.   enalapril (VASOTEC) 10 MG tablet, Take 20 mg by mouth in the morning and at bedtime.  (Patient not taking: Reported on 02/06/2022)   isosorbide mononitrate (IMDUR) 30 MG 24 hr tablet, Take 1 tablet (30 mg total) by mouth daily. (Patient not taking: Reported on 02/06/2022)   rosuvastatin (CRESTOR) 40 MG tablet, TAKE ONE TABLET BY MOUTH DAILY (Patient not taking: Reported on 02/06/2022)   Current Outpatient Medications (Analgesics):    acetaminophen (TYLENOL) 650 MG CR tablet, Take 650-1,300 mg by mouth every 8 (eight) hours as needed for pain.   aspirin EC 81 MG tablet, Take 81 mg by mouth daily.   Current Outpatient Medications (Other):    meclizine (ANTIVERT) 25 MG tablet, Take 25 mg by mouth 2 (two) times daily as needed for dizziness.    Multiple Vitamin (MULTIVITAMIN WITH MINERALS) TABS, Take 1 tablet by mouth daily. Centrum   pantoprazole (PROTONIX) 40 MG tablet, Take 40 mg by mouth daily as needed (gerd).   triamcinolone cream (KENALOG) 0.1 %, Apply 1 application topically 2 (two) times daily as needed (skin irritation/rash).   Do not take Diabetes Med Glucophage (Metformin) on the day of the procedure and HOLD 48 HOURS AFTER THE PROCEDURE.  On the morning of your procedure, take Aspirin and any morning medicines NOT listed above.  You may use sips of water.  5. Plan to go home the same day, you will only stay overnight if medically necessary. 6. You MUST have a responsible adult to drive you home. 7. An adult MUST be with you the first 24 hours after you arrive home. 8. Bring a current list of your medications, and the last time and date medication taken. 9. Bring ID and  current insurance cards. 10.Please wear clothes that are easy to get on and off and wear slip-on shoes.  Thank you for allowing [place] to care for you!   -- [place] [facility]    Important Information About Sugar

## 2022-02-07 ENCOUNTER — Ambulatory Visit (INDEPENDENT_AMBULATORY_CARE_PROVIDER_SITE_OTHER): Payer: Medicare Other

## 2022-02-07 DIAGNOSIS — I25119 Atherosclerotic heart disease of native coronary artery with unspecified angina pectoris: Secondary | ICD-10-CM | POA: Diagnosis not present

## 2022-02-07 LAB — ECHOCARDIOGRAM COMPLETE
Area-P 1/2: 2.42 cm2
Calc EF: 57.9 %
MV M vel: 2.21 m/s
MV Peak grad: 19.5 mmHg
S' Lateral: 2.22 cm
Single Plane A2C EF: 57 %
Single Plane A4C EF: 60.1 %

## 2022-02-08 ENCOUNTER — Other Ambulatory Visit: Payer: No Typology Code available for payment source

## 2022-02-08 ENCOUNTER — Other Ambulatory Visit (HOSPITAL_COMMUNITY): Payer: No Typology Code available for payment source

## 2022-02-08 DIAGNOSIS — Z299 Encounter for prophylactic measures, unspecified: Secondary | ICD-10-CM | POA: Diagnosis not present

## 2022-02-08 DIAGNOSIS — E1165 Type 2 diabetes mellitus with hyperglycemia: Secondary | ICD-10-CM | POA: Diagnosis not present

## 2022-02-08 DIAGNOSIS — I10 Essential (primary) hypertension: Secondary | ICD-10-CM | POA: Diagnosis not present

## 2022-02-08 DIAGNOSIS — R0789 Other chest pain: Secondary | ICD-10-CM | POA: Diagnosis not present

## 2022-02-11 ENCOUNTER — Telehealth: Payer: Self-pay | Admitting: *Deleted

## 2022-02-11 NOTE — Telephone Encounter (Signed)
Reviewed procedure instructions with patient.  

## 2022-02-11 NOTE — Telephone Encounter (Signed)
Cardiac Catheterization scheduled at [facility] for: Tuesday February 12, 2022 10:30 AM Arrival time and place: [place] Entrance A at: 8:30 AM   Nothing to eat after midnight prior to procedure, clear liquids until 5 AM day of procedure  Medication instructions: -Hold:  Lisinopril-HCT-AM of procedure  Metformin-day of procedure and 48 hours post procedure -Except hold medications usual morning medications can be taken with sips of water including aspirin 81 mg.  Confirmed patient has responsible adult to drive home post procedure and be with patient first 24 hours after arriving home.  Patient reports no new symptoms concerning for COVID-19/no exposure to COVID-19 in the past 10 days.  Call placed to patient to review procedure instructions, no answer, no voicemail.

## 2022-02-12 ENCOUNTER — Other Ambulatory Visit: Payer: Self-pay

## 2022-02-12 ENCOUNTER — Encounter (HOSPITAL_COMMUNITY)
Admission: RE | Disposition: A | Discharge status: Home / Self Care | Payer: Self-pay | Source: Home / Self Care | Attending: Cardiology

## 2022-02-12 ENCOUNTER — Encounter (HOSPITAL_COMMUNITY): Payer: Self-pay | Admitting: Cardiology

## 2022-02-12 ENCOUNTER — Ambulatory Visit (HOSPITAL_COMMUNITY)
Admission: RE | Admit: 2022-02-12 | Discharge: 2022-02-12 | Disposition: A | Discharge status: Home / Self Care | Payer: Medicare Other | Attending: Cardiology | Admitting: Cardiology

## 2022-02-12 DIAGNOSIS — I10 Essential (primary) hypertension: Secondary | ICD-10-CM | POA: Diagnosis not present

## 2022-02-12 DIAGNOSIS — I25119 Atherosclerotic heart disease of native coronary artery with unspecified angina pectoris: Secondary | ICD-10-CM | POA: Insufficient documentation

## 2022-02-12 DIAGNOSIS — E785 Hyperlipidemia, unspecified: Secondary | ICD-10-CM | POA: Diagnosis not present

## 2022-02-12 DIAGNOSIS — E119 Type 2 diabetes mellitus without complications: Secondary | ICD-10-CM | POA: Insufficient documentation

## 2022-02-12 DIAGNOSIS — J449 Chronic obstructive pulmonary disease, unspecified: Secondary | ICD-10-CM | POA: Diagnosis not present

## 2022-02-12 DIAGNOSIS — Z7984 Long term (current) use of oral hypoglycemic drugs: Secondary | ICD-10-CM | POA: Insufficient documentation

## 2022-02-12 DIAGNOSIS — Z7982 Long term (current) use of aspirin: Secondary | ICD-10-CM | POA: Insufficient documentation

## 2022-02-12 DIAGNOSIS — R079 Chest pain, unspecified: Secondary | ICD-10-CM | POA: Diagnosis present

## 2022-02-12 DIAGNOSIS — Z955 Presence of coronary angioplasty implant and graft: Secondary | ICD-10-CM | POA: Insufficient documentation

## 2022-02-12 DIAGNOSIS — F17210 Nicotine dependence, cigarettes, uncomplicated: Secondary | ICD-10-CM | POA: Diagnosis not present

## 2022-02-12 HISTORY — PX: LEFT HEART CATH AND CORONARY ANGIOGRAPHY: CATH118249

## 2022-02-12 LAB — GLUCOSE, CAPILLARY: Glucose-Capillary: 143 mg/dL — ABNORMAL HIGH (ref 70–99)

## 2022-02-12 SURGERY — LEFT HEART CATH AND CORONARY ANGIOGRAPHY
Anesthesia: LOCAL

## 2022-02-12 MED ORDER — HEPARIN (PORCINE) IN NACL 1000-0.9 UT/500ML-% IV SOLN
INTRAVENOUS | Status: DC | PRN
  Administered 2022-02-12 (×2): 500 mL

## 2022-02-12 MED ORDER — SODIUM CHLORIDE 0.9 % WEIGHT BASED INFUSION
3.0000 mL/kg/h | INTRAVENOUS | Status: AC
  Administered 2022-02-12: 3 mL/kg/h via INTRAVENOUS

## 2022-02-12 MED ORDER — ASPIRIN 81 MG PO CHEW: 81.0000 mg | CHEWABLE_TABLET | ORAL | Status: DC

## 2022-02-12 MED ORDER — HEPARIN SODIUM (PORCINE) 1000 UNIT/ML IJ SOLN
INTRAMUSCULAR | Status: AC
  Filled 2022-02-12: qty 10

## 2022-02-12 MED ORDER — HYDRALAZINE HCL 20 MG/ML IJ SOLN: 10.0000 mg | INTRAMUSCULAR | Status: DC | PRN

## 2022-02-12 MED ORDER — SODIUM CHLORIDE 0.9 % WEIGHT BASED INFUSION: 1.0000 mL/kg/h | INTRAVENOUS | Status: DC

## 2022-02-12 MED ORDER — SODIUM CHLORIDE 0.9 % IV SOLN: 250.0000 mL | INTRAVENOUS | Status: DC | PRN

## 2022-02-12 MED ORDER — HEPARIN (PORCINE) IN NACL 1000-0.9 UT/500ML-% IV SOLN
INTRAVENOUS | Status: AC
  Filled 2022-02-12: qty 1000

## 2022-02-12 MED ORDER — VERAPAMIL HCL 2.5 MG/ML IV SOLN
INTRAVENOUS | Status: AC
  Filled 2022-02-12: qty 2

## 2022-02-12 MED ORDER — FENTANYL CITRATE (PF) 100 MCG/2ML IJ SOLN
INTRAMUSCULAR | Status: DC | PRN
  Administered 2022-02-12: 25 ug via INTRAVENOUS

## 2022-02-12 MED ORDER — LIDOCAINE HCL (PF) 1 % IJ SOLN
INTRAMUSCULAR | Status: DC | PRN
  Administered 2022-02-12: 2 mL

## 2022-02-12 MED ORDER — HEPARIN SODIUM (PORCINE) 1000 UNIT/ML IJ SOLN
INTRAMUSCULAR | Status: DC | PRN
  Administered 2022-02-12: 4000 [IU] via INTRAVENOUS

## 2022-02-12 MED ORDER — ONDANSETRON HCL 4 MG/2ML IJ SOLN: 4.0000 mg | Freq: Four times a day (QID) | INTRAMUSCULAR | Status: DC | PRN

## 2022-02-12 MED ORDER — ACETAMINOPHEN 325 MG PO TABS: 650.0000 mg | ORAL_TABLET | ORAL | Status: DC | PRN

## 2022-02-12 MED ORDER — METFORMIN HCL 500 MG PO TABS: 1000.0000 mg | ORAL_TABLET | Freq: Two times a day (BID) | ORAL | 5 refills | Status: AC

## 2022-02-12 MED ORDER — VERAPAMIL HCL 2.5 MG/ML IV SOLN
INTRAVENOUS | Status: DC | PRN
  Administered 2022-02-12: 10 mL via INTRA_ARTERIAL

## 2022-02-12 MED ORDER — LIDOCAINE HCL (PF) 1 % IJ SOLN
INTRAMUSCULAR | Status: AC
  Filled 2022-02-12: qty 30

## 2022-02-12 MED ORDER — MIDAZOLAM HCL 2 MG/2ML IJ SOLN
INTRAMUSCULAR | Status: AC
  Filled 2022-02-12: qty 2

## 2022-02-12 MED ORDER — FENTANYL CITRATE (PF) 100 MCG/2ML IJ SOLN
INTRAMUSCULAR | Status: AC
  Filled 2022-02-12: qty 2

## 2022-02-12 MED ORDER — IOHEXOL 350 MG/ML SOLN
INTRAVENOUS | Status: DC | PRN
  Administered 2022-02-12: 50 mL

## 2022-02-12 MED ORDER — SODIUM CHLORIDE 0.9% FLUSH: 3.0000 mL | INTRAVENOUS | Status: DC | PRN

## 2022-02-12 MED ORDER — SODIUM CHLORIDE 0.9% FLUSH: 3.0000 mL | Freq: Two times a day (BID) | INTRAVENOUS | Status: DC

## 2022-02-12 MED ORDER — MIDAZOLAM HCL 2 MG/2ML IJ SOLN
INTRAMUSCULAR | Status: DC | PRN
  Administered 2022-02-12: 2 mg via INTRAVENOUS

## 2022-02-12 SURGICAL SUPPLY — 11 items
BAND CMPR LRG ZPHR (HEMOSTASIS) ×1
BAND ZEPHYR COMPRESS 30 LONG (HEMOSTASIS) ×1 IMPLANT
CATH 5FR JL3.5 JR4 ANG PIG MP (CATHETERS) ×1 IMPLANT
GLIDESHEATH SLEND SS 6F .021 (SHEATH) ×1 IMPLANT
GUIDEWIRE INQWIRE 1.5J.035X260 (WIRE) IMPLANT
INQWIRE 1.5J .035X260CM (WIRE) ×2
KIT HEART LEFT (KITS) ×2 IMPLANT
PACK CARDIAC CATHETERIZATION (CUSTOM PROCEDURE TRAY) ×2 IMPLANT
SHEATH PROBE COVER 6X72 (BAG) ×1 IMPLANT
TRANSDUCER W/STOPCOCK (MISCELLANEOUS) ×2 IMPLANT
TUBING CIL FLEX 10 FLL-RA (TUBING) ×2 IMPLANT

## 2022-02-12 NOTE — Discharge Instructions (Signed)
Radial Site Care  This sheet gives you information about how to care for yourself after your procedure. Your health care provider may also give you more specific instructions. If you have problems or questions, contact your health care provider. What can I expect after the procedure? After the procedure, it is common to have: Bruising and tenderness at the catheter insertion area. Follow these instructions at home: Medicines Take over-the-counter and prescription medicines only as told by your health care provider. Insertion site care Follow instructions from your health care provider about how to take care of your insertion site. Make sure you: Wash your hands with soap and water before you remove your bandage (dressing). If soap and water are not available, use hand sanitizer. May remove dressing in 24 hours. Check your insertion site every day for signs of infection. Check for: Redness, swelling, or pain. Fluid or blood. Pus or a bad smell. Warmth. Do no take baths, swim, or use a hot tub for 5 days. You may shower 24-48 hours after the procedure. Remove the dressing and gently wash the site with plain soap and water. Pat the area dry with a clean towel. Do not rub the site. That could cause bleeding. Do not apply powder or lotion to the site. Activity  For 24 hours after the procedure, or as directed by your health care provider: Do not flex or bend the affected arm. Do not push or pull heavy objects with the affected arm. Do not drive yourself home from the hospital or clinic. You may drive 24 hours after the procedure. Do not operate machinery or power tools. KEEP ARM ELEVATED THE REMAINDER OF THE DAY. Do not push, pull or lift anything that is heavier than 10 lb for 5 days. Ask your health care provider when it is okay to: Return to work or school. Resume usual physical activities or sports. Resume sexual activity. General instructions If the catheter site starts to  bleed, raise your arm and put firm pressure on the site. If the bleeding does not stop, get help right away. This is a medical emergency. DRINK PLENTY OF FLUIDS FOR THE NEXT 2-3 DAYS. No alcohol consumption for 24 hours after receiving sedation. If you went home on the same day as your procedure, a responsible adult should be with you for the first 24 hours after you arrive home. Keep all follow-up visits as told by your health care provider. This is important. Contact a health care provider if: You have a fever. You have redness, swelling, or yellow drainage around your insertion site. Get help right away if: You have unusual pain at the radial site. The catheter insertion area swells very fast. The insertion area is bleeding, and the bleeding does not stop when you hold steady pressure on the area. Your arm or hand becomes pale, cool, tingly, or numb. These symptoms may represent a serious problem that is an emergency. Do not wait to see if the symptoms will go away. Get medical help right away. Call your local emergency services (911 in the [place] Do not drive yourself to the hospital. Summary After the procedure, it is common to have bruising and tenderness at the site. Follow instructions from your health care provider about how to take care of your radial site wound. Check the wound every day for signs of infection.  This information is not intended to replace advice given to you by your health care provider. Make sure you discuss any questions you have with   your health care provider. Document Revised: 10/01/2017 Document Reviewed: 10/01/2017 Elsevier Patient Education  2020 Elsevier Inc.  

## 2022-02-12 NOTE — Progress Notes (Signed)
Pt ambulated without difficulty or bleeding.  Arm board applied to (R) arm with instructions to remove tomorrow and leave off Discharged home with son who will drive and stay with pt x 24 hrs

## 2022-02-12 NOTE — Interval H&P Note (Signed)
History and Physical Interval Note:  02/12/2022 1:00 PM  Richard Rocha  has presented today for surgery, with the diagnosis of chest pain.  The various methods of treatment have been discussed with the patient and family. After consideration of risks, benefits and other options for treatment, the patient has consented to  Procedure(s): LEFT HEART CATH AND CORONARY ANGIOGRAPHY (N/A) as a surgical intervention.  The patient's history has been reviewed, patient examined, no change in status, stable for surgery.  I have reviewed the patient's chart and labs.  Questions were answered to the patient's satisfaction.   Cath Lab Visit (complete for each Cath Lab visit)  Clinical Evaluation Leading to the Procedure:   ACS: No.  Non-ACS:    Anginal Classification: CCS II  Anti-ischemic medical therapy: Maximal Therapy (2 or more classes of medications)  Non-Invasive Test Results: Low-risk stress test findings: cardiac mortality <1%/year  Prior CABG: No previous CABG        Collier [facility] 02/12/2022 1:00 PM

## 2022-02-13 ENCOUNTER — Telehealth: Payer: Self-pay | Admitting: Cardiovascular Disease

## 2022-02-13 NOTE — Telephone Encounter (Signed)
Pt wanting to know when will he be able to return back to work after having procedure yesterday. Please advise

## 2022-02-13 NOTE — Telephone Encounter (Signed)
Per Dr. Johnsie Cancel, Yes, he had no obstructive CAD and no intervention done. Informed patient of Dr. Kyla Balzarine advisement and told him to just review the precautions of no heavy lifting with arm that was used in the procedure, and all the other discharge instructions. Patient verbalized understanding.

## 2022-03-12 NOTE — Progress Notes (Deleted)
Cardiology Office Note    Date:  03/12/2022   ID:  Herndon, Grill 11-26-1940, MRN [id number]  PCP:  Monico Blitz, MD  Cardiologist:  Jenkins Rouge, MD  Electrophysiologist:  None   Chief Complaint: ***  History of Present Illness:   Richard Rocha is a 81 y.o. male with history of CAD s/p DES to LAD 03/2020, HTN, HLD, DM, tobacco abuse, mild emphysema/COPD by screening chest CT 04/2021, gastritis/duodenitis, hiatal hernia, Schatzki's ring who is seen for follow-up.  He lives alone. His wife died 4 years ago and his dog died about a year after that.  He had prior PCI in 2021 as above, with stress testing 09/2021 that was equivocal and recommended for medical therapy. He was recently evaluated for chest pain and had been seen in an outside ED 01/2022 with negative troponins and elevated blood pressure. Cardiac cath was pursued 02/12/22 with nonobstructive CAD as outlined below, normal LVF, normal LVEDP. Echo done 02/07/22 showed EF 55-60%, mild ABSH, G1DD, normal RV.  Cath site Truck driving  CAD Essential HTN Hyperlipidemia Tobacco abuse   Labwork independently reviewed: 01/2022 Hgb 13.7, plt wnl, K 4.1, Cr 0.97 01/2022 hsTroponin negx2, Hgb 15.5, plt 188, CMET wnl K 3.9, Cr 0.96 KPN 01/2022 A1C 7.3 KPN 11/2021 Tchol 97, HDL 47, LDL 32, trig 91, TSH wnl 10/2020 LDL 20  Cardiology Studies:   Studies reviewed are outlined and summarized above. Reports included below if pertinent.   Cath 02/12/22   Ost LAD to Prox LAD lesion is 30% stenosed.   Dist LAD lesion is 40% stenosed.   3rd Mrg lesion is 30% stenosed.   Non-stenotic Mid LAD lesion was previously treated.   The left ventricular systolic function is normal.   LV end diastolic pressure is normal.   The left ventricular ejection fraction is 55-65% by visual estimate.   Nonobstructive CAD Normal LV function Normal LVEDP   Plan: continued medical therapy    Echo 02/2022     1. Left ventricular ejection fraction, by  estimation, is 55 to 60%. The  left ventricle has normal function. The left ventricle has no regional  wall motion abnormalities. There is mild asymmetric left ventricular  hypertrophy of the basal segment. Left  ventricular diastolic parameters are consistent with Grade I diastolic  dysfunction (impaired relaxation). The average left ventricular global  longitudinal strain is -22.3 %. The global longitudinal strain is normal.   2. Right ventricular systolic function is normal. The right ventricular  size is normal. There is normal pulmonary artery systolic pressure. The  estimated right ventricular systolic pressure is 48.1 mmHg.   3. The mitral valve is grossly normal. Trivial mitral valve  regurgitation.   4. The aortic valve is tricuspid. Aortic valve regurgitation is not  visualized.   5. The inferior vena cava is normal in size with greater than 50%  respiratory variability, suggesting right atrial pressure of 3 mmHg.   Comparison(s): No prior Echocardiogram. Nuclear stress test done 09/21/21  showed an EF of 63%.     Past Medical History:  Diagnosis Date   CAD (coronary artery disease)    a. s/p DES to mid-LAD in 03/2020   Diabetes mellitus type II    Dyslipidemia    Essential hypertension    Gastritis and duodenitis    EGD 03/2012   Hiatal hernia    Schatzki's ring    Tobacco abuse     Past Surgical History:  Procedure Laterality Date  CARDIAC CATHETERIZATION     CHOLECYSTECTOMY     COLONOSCOPY     COLONOSCOPY N/A 09/18/2018   Procedure: COLONOSCOPY;  Surgeon: [name], MD;  [facility];  Service: Endoscopy;  Laterality: N/A;  8:30am   CORONARY STENT INTERVENTION N/A 03/28/2020   Procedure: CORONARY STENT INTERVENTION;  Surgeon: [name], MD;  Location: [place] [facility];  Service: Cardiovascular;  Laterality: N/A;   ESOPHAGOGASTRODUODENOSCOPY  03/25/2012   Procedure: ESOPHAGOGASTRODUODENOSCOPY (EGD);  Surgeon: [name], MD;   Location: [place];  Service: Gastroenterology;  Laterality: N/A;   INGUINAL HERNIA REPAIR     bilateral   INTRAVASCULAR PRESSURE WIRE/FFR STUDY N/A 03/28/2020   Procedure: INTRAVASCULAR PRESSURE WIRE/FFR STUDY;  Surgeon: [name], MD;  Location: [place] [facility];  Service: Cardiovascular;  Laterality: N/A;   JOINT REPLACEMENT     ltknee arthroscopic,rt shoulder arthroscopic   KNEE SURGERY     left arthroscopy   LEFT HEART CATH AND CORONARY ANGIOGRAPHY N/A 03/28/2020   Procedure: LEFT HEART CATH AND CORONARY ANGIOGRAPHY;  Surgeon: [name], MD;  Location: [place] [facility];  Service: Cardiovascular;  Laterality: N/A;   LEFT HEART CATH AND CORONARY ANGIOGRAPHY N/A 02/12/2022   Procedure: LEFT HEART CATH AND CORONARY ANGIOGRAPHY;  Surgeon: Martinique, Peter M, MD;  Location: [place] [facility];  Service: Cardiovascular;  Laterality: N/A;   LEFT HEART CATHETERIZATION WITH CORONARY ANGIOGRAM N/A 03/23/2012   Procedure: LEFT HEART CATHETERIZATION WITH CORONARY ANGIOGRAM;  Surgeon: [name], MD;  Location: [facility] LAB;  Service: Cardiovascular;  Laterality: N/A;   POLYPECTOMY  09/18/2018   Procedure: POLYPECTOMY;  Surgeon: [name], MD;  [facility];  Service: Endoscopy;;  hepatic flexure (CSx2), cecal (CSx1), splenic flexure(CSx2)   SHOULDER SURGERY     right    Current Medications: No outpatient medications have been marked as taking for the 03/13/22 encounter (Appointment) with Charlie Pitter, PA-C.   Current Facility-Administered Medications for the 03/13/22 encounter (Appointment) with Charlie Pitter, PA-C  Medication   sodium chloride flush (NS) 0.9 % injection 3 mL   ***   Allergies:   Morphine and related and Scopolamine   Social History   Socioeconomic History   Marital status: Widowed    Spouse name: Not on file   Number of children: 3   Years of education: Not on file   Highest education level: Not on file  Occupational History    Occupation: Truck driving (part time)    Employer: [employer]  Tobacco Use   Smoking status: Every Day    Packs/day: 1.00    Years: 60.00    Total pack years: 60.00    Types: Cigarettes    Start date: 08/23/1959   Smokeless tobacco: Never   Tobacco comments:    He has quit many times.  Vaping Use   Vaping Use: Never used  Substance and Sexual Activity   Alcohol use: Yes    Alcohol/week: 1.0 standard drink of alcohol    Types: 1 Shots of liquor per week    Comment: once in a while   Drug use: No   Sexual activity: Yes    Birth control/protection: None  Other Topics Concern   Not on file  Social History Narrative   Lives with wife.     Social Determinants of Health   Financial Resource Strain: Not on file  Food Insecurity: Not on file  Transportation Needs: Not on  file  Physical Activity: Not on file  Stress: Not on file  Social Connections: Not on file     Family History:  The patient's ***family history includes Cancer in his father; Coronary artery disease (age of onset: 64) in his brother.  ROS:   Please see the history of present illness. Otherwise, review of systems is positive for ***.  All other systems are reviewed and otherwise negative.    EKG(s)/Additional Labs   EKG:  EKG is ordered today, personally reviewed, demonstrating ***  Recent Labs: 02/06/2022: BUN 18; Creatinine, Ser 0.97; Hemoglobin 13.7; Platelets 170; Potassium 4.1; Sodium 140  Recent Lipid Panel    Component Value Date/Time   CHOL 81 10/26/2020 0839   TRIG 119 10/26/2020 0839   HDL 37 (L) 10/26/2020 0839   CHOLHDL 2.2 10/26/2020 0839   VLDL 24 10/26/2020 0839   LDLCALC 20 10/26/2020 0839    PHYSICAL EXAM:    VS:  There were no vitals taken for this visit.  BMI: There is no height or weight on file to calculate BMI.  GEN: Well nourished, well developed male in no acute distress HEENT: normocephalic, atraumatic Neck: no JVD, carotid bruits, or masses Cardiac: ***RRR; no  murmurs, rubs, or gallops, no edema  Respiratory:  clear to auscultation bilaterally, normal work of breathing GI: soft, nontender, nondistended, + BS MS: no deformity or atrophy Skin: warm and dry, no rash Neuro:  Alert and Oriented x 3, Strength and sensation are intact, follows commands Psych: euthymic mood, full affect  Wt Readings from Last 3 Encounters:  02/12/22 165 lb (74.8 kg)  02/06/22 165 lb 12.8 oz (75.2 kg)  10/17/21 168 lb (76.2 kg)     ASSESSMENT & PLAN:   ***     Disposition: F/u with ***   Medication Adjustments/Labs and Tests Ordered: Current medicines are reviewed at length with the patient today.  Concerns regarding medicines are outlined above. Medication changes, Labs and Tests ordered today are summarized above and listed in the Patient Instructions accessible in Encounters.   Signed, Charlie Pitter, PA-C  03/12/2022 9:28 AM    [place] Phone: [phone]; Fax: [phone]

## 2022-03-13 ENCOUNTER — Ambulatory Visit: Payer: Self-pay | Admitting: Physician Assistant

## 2022-03-13 DIAGNOSIS — E785 Hyperlipidemia, unspecified: Secondary | ICD-10-CM

## 2022-03-13 DIAGNOSIS — I10 Essential (primary) hypertension: Secondary | ICD-10-CM

## 2022-03-13 DIAGNOSIS — I2510 Atherosclerotic heart disease of native coronary artery without angina pectoris: Secondary | ICD-10-CM

## 2022-03-13 DIAGNOSIS — Z720 Tobacco use: Secondary | ICD-10-CM

## 2022-03-25 DIAGNOSIS — Z299 Encounter for prophylactic measures, unspecified: Secondary | ICD-10-CM | POA: Diagnosis not present

## 2022-03-25 DIAGNOSIS — H6592 Unspecified nonsuppurative otitis media, left ear: Secondary | ICD-10-CM | POA: Diagnosis not present

## 2022-03-25 DIAGNOSIS — F17210 Nicotine dependence, cigarettes, uncomplicated: Secondary | ICD-10-CM | POA: Diagnosis not present

## 2022-03-25 DIAGNOSIS — I10 Essential (primary) hypertension: Secondary | ICD-10-CM | POA: Diagnosis not present

## 2022-03-25 DIAGNOSIS — H8309 Labyrinthitis, unspecified ear: Secondary | ICD-10-CM | POA: Diagnosis not present

## 2022-04-01 DIAGNOSIS — Z299 Encounter for prophylactic measures, unspecified: Secondary | ICD-10-CM | POA: Diagnosis not present

## 2022-04-01 DIAGNOSIS — I951 Orthostatic hypotension: Secondary | ICD-10-CM | POA: Diagnosis not present

## 2022-04-01 DIAGNOSIS — F17210 Nicotine dependence, cigarettes, uncomplicated: Secondary | ICD-10-CM | POA: Diagnosis not present

## 2022-04-01 DIAGNOSIS — R001 Bradycardia, unspecified: Secondary | ICD-10-CM | POA: Diagnosis not present

## 2022-04-01 DIAGNOSIS — H8309 Labyrinthitis, unspecified ear: Secondary | ICD-10-CM | POA: Diagnosis not present

## 2022-04-01 DIAGNOSIS — I10 Essential (primary) hypertension: Secondary | ICD-10-CM | POA: Diagnosis not present

## 2022-04-03 ENCOUNTER — Telehealth: Payer: Self-pay | Admitting: Cardiovascular Disease

## 2022-04-03 NOTE — Telephone Encounter (Signed)
STAT if patient feels like he/she is going to faint   Are you dizzy now? Dizzy when he stand up and kneel down- not at this time  Do you feel faint or have you passed out?  no  Do you have any other symptoms? no  Have you checked your HR and BP (record if available)? Patient says he would like to be seen asap by somebody please-

## 2022-04-03 NOTE — Telephone Encounter (Signed)
Spoke with pt who states that he has been dizzy for the last 2 weeks. He reports feeling dizzy when he stands or kneels down. BP today was 124/77 HR. 60. Has been seen by his PCP and told to get in touch with our office. Will request office note and EKG done in PCP office. Please advise.

## 2022-04-04 NOTE — Telephone Encounter (Signed)
Patient states he had lab work done 3 days ago by pcp. I have faxed request to review.Mr.Richard Rocha stats his dizziness is getting somewhat better now and has f/u with pcp next Monday. He may ask for referral to ENT. I told him we would call after Dr.Nishan reviews his labs.

## 2022-04-08 DIAGNOSIS — I10 Essential (primary) hypertension: Secondary | ICD-10-CM | POA: Diagnosis not present

## 2022-04-08 DIAGNOSIS — H8309 Labyrinthitis, unspecified ear: Secondary | ICD-10-CM | POA: Diagnosis not present

## 2022-04-08 DIAGNOSIS — Z299 Encounter for prophylactic measures, unspecified: Secondary | ICD-10-CM | POA: Diagnosis not present

## 2022-04-08 DIAGNOSIS — E1165 Type 2 diabetes mellitus with hyperglycemia: Secondary | ICD-10-CM | POA: Diagnosis not present

## 2022-04-10 ENCOUNTER — Encounter: Payer: Self-pay | Admitting: Internal Medicine

## 2022-04-12 DIAGNOSIS — E1165 Type 2 diabetes mellitus with hyperglycemia: Secondary | ICD-10-CM | POA: Diagnosis not present

## 2022-04-12 DIAGNOSIS — I10 Essential (primary) hypertension: Secondary | ICD-10-CM | POA: Diagnosis not present

## 2022-04-12 DIAGNOSIS — M79606 Pain in leg, unspecified: Secondary | ICD-10-CM | POA: Diagnosis not present

## 2022-04-12 DIAGNOSIS — Z299 Encounter for prophylactic measures, unspecified: Secondary | ICD-10-CM | POA: Diagnosis not present

## 2022-04-12 DIAGNOSIS — J449 Chronic obstructive pulmonary disease, unspecified: Secondary | ICD-10-CM | POA: Diagnosis not present

## 2022-04-12 DIAGNOSIS — F17210 Nicotine dependence, cigarettes, uncomplicated: Secondary | ICD-10-CM | POA: Diagnosis not present

## 2022-04-22 DIAGNOSIS — I739 Peripheral vascular disease, unspecified: Secondary | ICD-10-CM | POA: Diagnosis not present

## 2022-04-22 DIAGNOSIS — I70211 Atherosclerosis of native arteries of extremities with intermittent claudication, right leg: Secondary | ICD-10-CM | POA: Diagnosis not present

## 2022-04-23 NOTE — Progress Notes (Signed)
Cardiology Office Note    Date:  04/25/2022   ID:  Richard Rocha [birth year], MRN [id number]  PCP:  Monico Blitz, MD  Cardiologist:  Jenkins Rouge, MD  Electrophysiologist:  None   Chief Complaint: f/u  CAD   History of Present Illness:   Richard Rocha is a 81 y.o. male with history of CAD s/p DES to LAD 03/2020, HTN, HLD, DM, tobacco abuse, mild emphysema/COPD by screening chest CT 04/2021, gastritis/duodenitis, hiatal hernia, Schatzki's ring who is seen for follow-up. His last cath was at time of PCI in 03/2020 with residual nonobstructive disease managed medically; EF 55-60%. He had a stress test 09/2021 for chest pain with mixed features which was overall low risk. There was an inferior/inferoseptal defect in the setting of diaphragmatic attenuation, cannot exclude prior infarct scar and also a minor inferior apical ischemic territory, EF 63%  Plavix discontinued at [facility] 10/2021 since >1 year from DES. He was seen in an outside ED 01/31/22 with chest pain and elevated blood pressure.Troponins were negative and he was able to be discharged home. F/U cath 02/12/22 no obstructive Dx patient stent to mid/distal LAD   His wife died 5 years ago and his dog died about a year after that.  Has had some dizziness since end of July Not cardiac related seeing primary Getting referral to ENT HCT and electrolytes normal on 04-04-2022   Oldest son died this year from sepsis after having gangrene in leg and refusing amputation His brother also passed away from cancer this year   Cardiology Studies:   Studies reviewed are outlined and summarized above. Reports included below if pertinent.   Cath 02/12/22  Conclusion      Ost LAD to Prox LAD lesion is 30% stenosed.   Dist LAD lesion is 40% stenosed.   3rd Mrg lesion is 30% stenosed.   Non-stenotic Mid LAD lesion was previously treated.   The left ventricular systolic function is normal.   LV end diastolic pressure is normal.   The left ventricular  ejection fraction is 55-65% by visual estimate.   Nonobstructive CAD Normal LV function Normal LVEDP   Plan: continued medical therapy    NST 09/2021   Findings are equivocal. The study is low risk.   No ST deviation was noted. The ECG was negative for ischemia.   LV perfusion is abnormal.  Moderate sized, moderate intensity, apical to basal inferior/inferoseptal defect that is predominantly fixed and associated with normal wall motion suggestive of diaphragmatic attenuation, although cannot exclude infarct scar.  There is partial reversibility at the inferior apex suggesting a minor ischemic territory.   Left ventricular function is normal. Nuclear stress EF: 63 %.   Overall low risk study.  There is an inferior/inferoseptal defect in the setting of diaphragmatic attenuation, cannot exclude prior infarct scar and also a minor inferior apical ischemic territory.  LVEF 63% with normal wall motion.      Past Medical History:  Diagnosis Date   CAD (coronary artery disease)    a. s/p DES to mid-LAD in 03/2020   Diabetes mellitus type II    Dyslipidemia    Essential hypertension    Gastritis and duodenitis    EGD 03/2012   Hiatal hernia    Schatzki's ring    Tobacco abuse     Past Surgical History:  Procedure Laterality Date   CARDIAC CATHETERIZATION     CHOLECYSTECTOMY     COLONOSCOPY     COLONOSCOPY N/A 09/18/2018  Procedure: COLONOSCOPY;  Surgeon: [name], MD;  [facility];  Service: Endoscopy;  Laterality: N/A;  8:30am   CORONARY STENT INTERVENTION N/A 03/28/2020   Procedure: CORONARY STENT INTERVENTION;  Surgeon: [name], MD;  Location: [facility];  Service: Cardiovascular;  Laterality: N/A;   ESOPHAGOGASTRODUODENOSCOPY  03/25/2012   Procedure: ESOPHAGOGASTRODUODENOSCOPY (EGD);  Surgeon: [name], MD;  Location: [facility];  Service: Gastroenterology;  Laterality: N/A;   INGUINAL HERNIA REPAIR     bilateral   INTRAVASCULAR  PRESSURE WIRE/FFR STUDY N/A 03/28/2020   Procedure: INTRAVASCULAR PRESSURE WIRE/FFR STUDY;  Surgeon: [name], MD;  Location: [facility];  Service: Cardiovascular;  Laterality: N/A;   JOINT REPLACEMENT     ltknee arthroscopic,rt shoulder arthroscopic   KNEE SURGERY     left arthroscopy   LEFT HEART CATH AND CORONARY ANGIOGRAPHY N/A 03/28/2020   Procedure: LEFT HEART CATH AND CORONARY ANGIOGRAPHY;  Surgeon: [name], MD;  Location: [facility];  Service: Cardiovascular;  Laterality: N/A;   LEFT HEART CATH AND CORONARY ANGIOGRAPHY N/A 02/12/2022   Procedure: LEFT HEART CATH AND CORONARY ANGIOGRAPHY;  Surgeon: Martinique, Kindred Reidinger M, MD;  Location: [facility];  Service: Cardiovascular;  Laterality: N/A;   LEFT HEART CATHETERIZATION WITH CORONARY ANGIOGRAM N/A 03/23/2012   Procedure: LEFT HEART CATHETERIZATION WITH CORONARY ANGIOGRAM;  Surgeon: [name], MD;  Location: [facility];  Service: Cardiovascular;  Laterality: N/A;   POLYPECTOMY  09/18/2018   Procedure: POLYPECTOMY;  Surgeon: [name], MD;  [facility];  Service: Endoscopy;;  hepatic flexure (CSx2), cecal (CSx1), splenic flexure(CSx2)   SHOULDER SURGERY     right    Current Medications: Current Meds  Medication Sig   amLODipine (NORVASC) 5 MG tablet Take 1 tablet (5 mg total) by mouth daily.   aspirin EC 81 MG tablet Take 81 mg by mouth daily.   bismuth subsalicylate (PEPTO BISMOL) 262 MG/15ML suspension Take 30 mLs by mouth every 6 (six) hours as needed for indigestion or diarrhea or loose stools.   Carboxymethylcellul-Glycerin (LUBRICATING EYE DROPS OP) Place 1 drop into both eyes daily as needed (irritation).   isosorbide mononitrate (IMDUR) 30 MG 24 hr tablet Take 1 tablet (30 mg total) by mouth daily.   lisinopril-hydrochlorothiazide (ZESTORETIC) 20-12.5 MG tablet Take 1 tablet by mouth daily.   meclizine (ANTIVERT) 25 MG tablet Take 25 mg by mouth 2 (two) times daily as  needed for dizziness.    metFORMIN (GLUCOPHAGE) 500 MG tablet Take 2 tablets (1,000 mg total) by mouth 2 (two) times daily.   Multiple Vitamin (MULTIVITAMIN WITH MINERALS) TABS Take 1 tablet by mouth daily. Centrum   nitroGLYCERIN (NITROSTAT) 0.4 MG SL tablet Place 1 tablet (0.4 mg total) under the tongue every 5 (five) minutes as needed for chest pain. (Not to exceed 3 in a 15 minute time frame.  If no relief after second dose, call 911.   pantoprazole (PROTONIX) 40 MG tablet Take 40 mg by mouth 2 (two) times daily.   rosuvastatin (CRESTOR) 40 MG tablet TAKE ONE TABLET BY MOUTH DAILY   tamsulosin (FLOMAX) 0.4 MG CAPS capsule Take 0.4 mg by mouth daily.   triamcinolone cream (KENALOG) 0.1 % Apply 1 application topically 2 (two) times daily as needed (skin irritation/rash).    Current Facility-Administered Medications for the 04/25/22 encounter (Office Visit) with [name], MD  Medication   sodium chloride flush (NS) 0.9 % injection  3 mL      Allergies:   Morphine and related and Scopolamine   Social History   Socioeconomic History   Marital status: Widowed    Spouse name: Not on file   Number of children: 3   Years of education: Not on file   Highest education level: Not on file  Occupational History   Occupation: Truck driving (part time)    Employer: [employer]  Tobacco Use   Smoking status: Every Day    Packs/day: 1.00    Years: 60.00    Total pack years: 60.00    Types: Cigarettes    Start date: 08/23/1959   Smokeless tobacco: Never   Tobacco comments:    He has quit many times.  Vaping Use   Vaping Use: Never used  Substance and Sexual Activity   Alcohol use: Yes    Alcohol/week: 1.0 standard drink of alcohol    Types: 1 Shots of liquor per week    Comment: once in a while   Drug use: No   Sexual activity: Yes    Birth control/protection: None  Other Topics Concern   Not on file  Social History Narrative   Lives with wife.     Social Determinants of  Health   Financial Resource Strain: Not on file  Food Insecurity: Not on file  Transportation Needs: Not on file  Physical Activity: Not on file  Stress: Not on file  Social Connections: Not on file     Family History:  The patient's family history includes Cancer in his father; Coronary artery disease (age of onset: 30) in his brother.  ROS:   Please see the history of present illness.  All other systems are reviewed and otherwise negative.    EKG(s)/Additional Labs   EKG:  EKG is ordered today, personally reviewed, demonstrating NSR 61bpm, incomplete RBBB, nonspecific STT changes, similar to prior.  Recent Labs: 02/06/2022: BUN 18; Creatinine, Ser 0.97; Hemoglobin 13.7; Platelets 170; Potassium 4.1; Sodium 140  Recent Lipid Panel    Component Value Date/Time   CHOL 81 10/26/2020 0839   TRIG 119 10/26/2020 0839   HDL 37 (L) 10/26/2020 0839   CHOLHDL 2.2 10/26/2020 0839   VLDL 24 10/26/2020 0839   LDLCALC 20 10/26/2020 0839    PHYSICAL EXAM:    VS:  BP 124/68   Pulse 75   Ht '5\' 7"'$  (1.702 m)   Wt 159 lb 12.8 oz (72.5 kg)   SpO2 99%   BMI 25.03 kg/m   BMI: Body mass index is 25.03 kg/m.  Affect appropriate Healthy:  appears stated age 77: normal Neck supple with no adenopathy JVP normal no bruits no thyromegaly Lungs clear with no wheezing and good diaphragmatic motion Heart:  S1/S2 no murmur, no rub, gallop or click PMI normal Abdomen: benighn, BS positve, no tenderness, no AAA no bruit.  No HSM or HJR Distal pulses intact with no bruits No edema Neuro non-focal Skin warm and dry No muscular weakness   Wt Readings from Last 3 Encounters:  04/25/22 159 lb 12.8 oz (72.5 kg)  02/12/22 165 lb (74.8 kg)  02/06/22 165 lb 12.8 oz (75.2 kg)     ASSESSMENT & PLAN:   1. CAD Cath done 02/12/22 with no obstructive dx and patent stent to mid/distal LAD continue medial Rx  EF 55-60% by TTE 02/07/22   2. Essential HTN - Well controlled.  Continue current  medications and low sodium Dash type diet.    3.  Hyperlipidemia - excellent control by [facility] 11/2021 Tchol 97, HDL 47, LDL 32, trig 91.  Continue statin   4. History of tobacco abuse - counseled on cessation. CT negative 04/20/21 will order repeat lung cancer screening CT  Lung cancer CT   Disposition: F/u in a year    Medication Adjustments/Labs and Tests Ordered: Current medicines are reviewed at length with the patient today.  Concerns regarding medicines are outlined above. Medication changes, Labs and Tests ordered today are summarized above and listed in the Patient Instructions accessible in Encounters.   Signed, Jenkins Rouge, MD  04/25/2022 9:18 AM    [place] Phone: [phone]; Fax: [phone]

## 2022-04-25 ENCOUNTER — Encounter: Payer: Self-pay | Admitting: Cardiovascular Disease

## 2022-04-25 ENCOUNTER — Ambulatory Visit: Payer: Medicare Other | Admitting: Cardiovascular Disease

## 2022-04-25 VITALS — BP 124/68 | HR 75 | Ht 67.0 in | Wt 159.8 lb

## 2022-04-25 DIAGNOSIS — I10 Essential (primary) hypertension: Secondary | ICD-10-CM

## 2022-04-25 DIAGNOSIS — E785 Hyperlipidemia, unspecified: Secondary | ICD-10-CM | POA: Diagnosis not present

## 2022-04-25 DIAGNOSIS — Z720 Tobacco use: Secondary | ICD-10-CM | POA: Diagnosis not present

## 2022-04-25 DIAGNOSIS — I25119 Atherosclerotic heart disease of native coronary artery with unspecified angina pectoris: Secondary | ICD-10-CM

## 2022-04-25 NOTE — Patient Instructions (Signed)
Medication Instructions:  Your physician recommends that you continue on your current medications as directed. Please refer to the Current Medication list given to you today.  *If you need a refill on your cardiac medications before your next appointment, please call your pharmacy*   Lab Work: NONE   If you have labs (blood work) drawn today and your tests are completely normal, you will receive your results only by: [place] (if you have MyChart) OR A paper copy in the mail If you have any lab test that is abnormal or we need to change your treatment, we will call you to review the results.   Testing/Procedures: Lung cancer screening CT    Follow-Up: At [facility], you and your health needs are our priority.  As part of our continuing mission to provide you with exceptional heart care, we have created designated Provider Care Teams.  These Care Teams include your primary Cardiologist (physician) and Advanced Practice Providers (APPs -  Physician Assistants and Nurse Practitioners) who all work together to provide you with the care you need, when you need it.  We recommend signing up for the patient portal called "MyChart".  Sign up information is provided on this After Visit Summary.  MyChart is used to connect with patients for Virtual Visits (Telemedicine).  Patients are able to view lab/test results, encounter notes, upcoming appointments, etc.  Non-urgent messages can be sent to your provider as well.   To learn more about what you can do with MyChart, go to [email or web address].    Your next appointment:   1 year(s)  The format for your next appointment:   In Person  Provider:   Jenkins Rouge, MD    Other Instructions Thank you for choosing [facility]!    Important Information About Sugar

## 2022-05-03 ENCOUNTER — Ambulatory Visit (HOSPITAL_COMMUNITY): Payer: Medicare Other

## 2022-05-14 IMAGING — DX DG CHEST 1V PORT
1 series · 1 of 1 positions shown · non-contrast
Comparison: Radiographs 09/11/2021 and 10/19/2020.  CT 04/19/2021.

CLINICAL DATA: Chest pain today.

EXAM:
PORTABLE CHEST 1 VIEW

[chest ap]
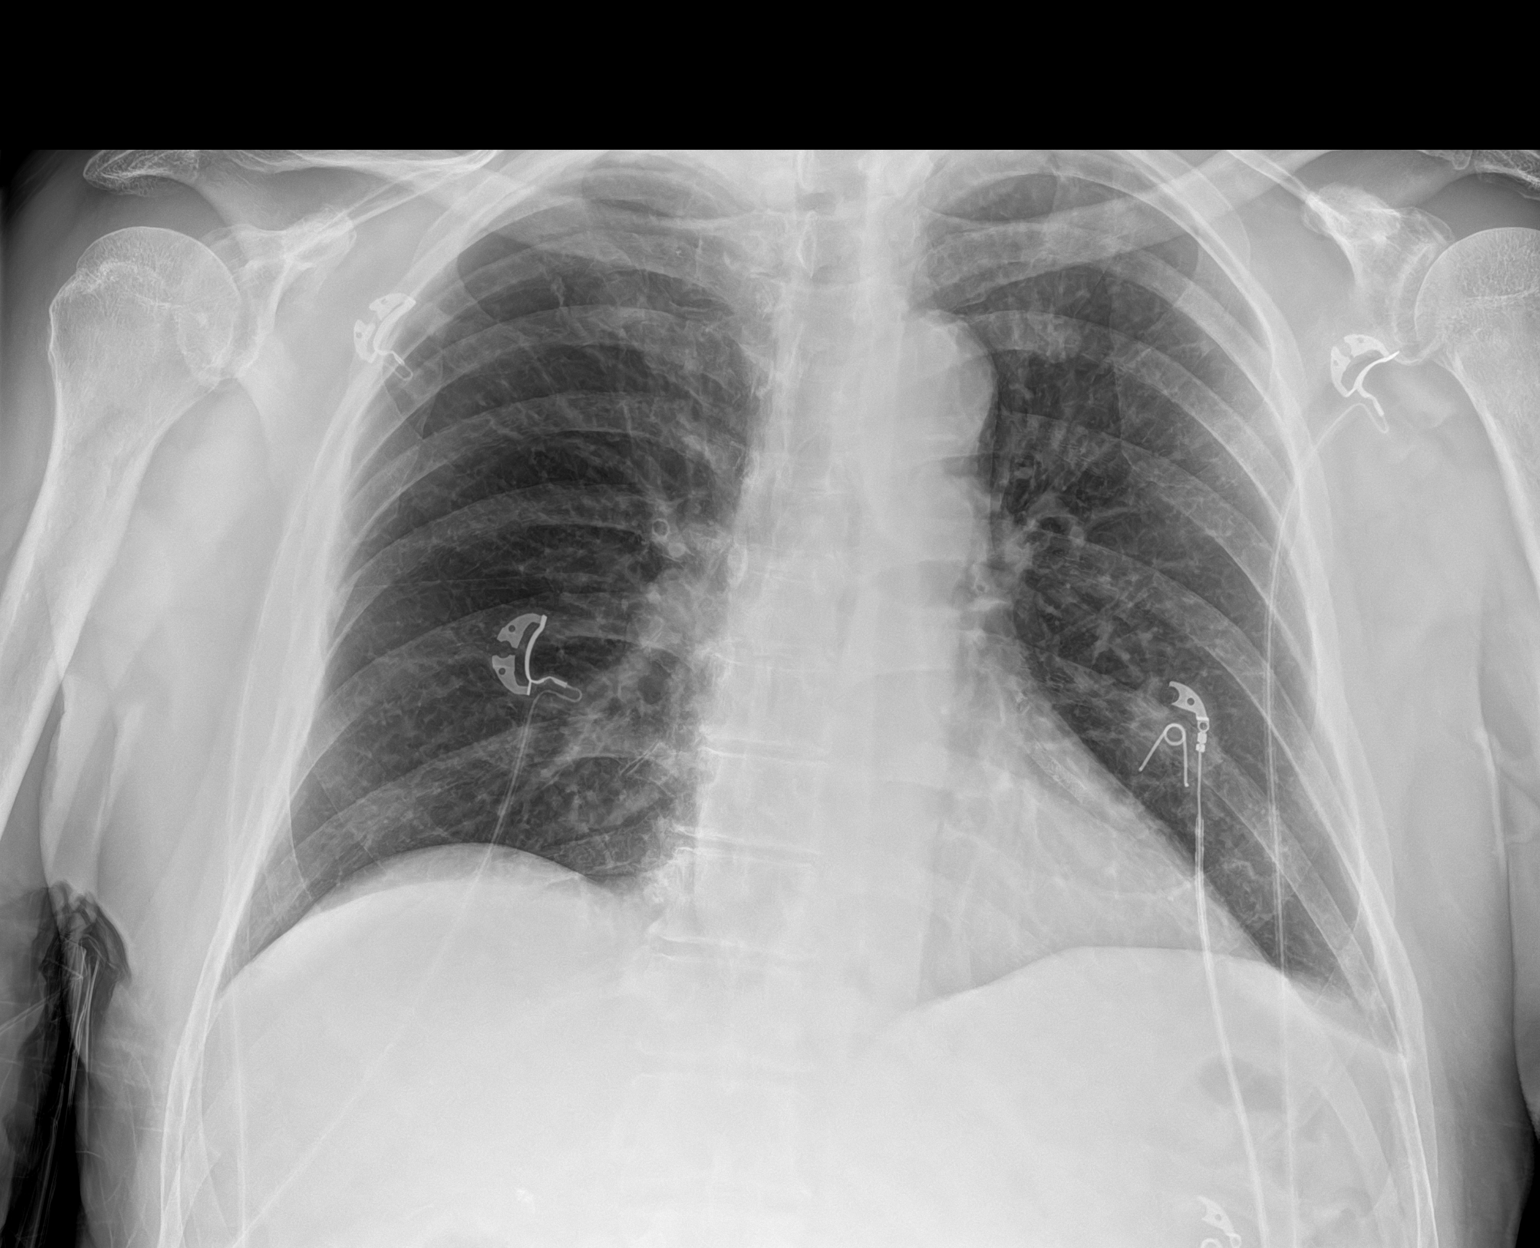

[1 of 1 positions shown; findings below may reference images not displayed]

FINDINGS: 1462 hours. The heart size and mediastinal contours are normal. The
lungs are clear. There is no pleural effusion or pneumothorax. No
acute osseous findings are identified. Telemetry leads overlie the
chest.
IMPRESSION: Stable chest.  No active cardiopulmonary process.

## 2022-05-17 ENCOUNTER — Telehealth: Payer: Self-pay | Admitting: Cardiovascular Disease

## 2022-05-17 ENCOUNTER — Encounter: Payer: Self-pay | Admitting: Cardiovascular Disease

## 2022-05-17 NOTE — Telephone Encounter (Signed)
Called patient to let him know paperwork is ready. Tried to fax twice to Urgent Care in [place], fax did not go through. Faxed to [place] office where patient is going to pick up.

## 2022-05-17 NOTE — Telephone Encounter (Signed)
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

## 2022-05-28 ENCOUNTER — Ambulatory Visit (HOSPITAL_COMMUNITY)
Admission: RE | Admit: 2022-05-28 | Discharge: 2022-05-28 | Disposition: A | Discharge status: Home / Self Care | Payer: Medicare Other | Source: Ambulatory Visit | Attending: Cardiovascular Disease | Admitting: Cardiovascular Disease

## 2022-05-28 DIAGNOSIS — J8410 Pulmonary fibrosis, unspecified: Secondary | ICD-10-CM | POA: Diagnosis not present

## 2022-05-28 DIAGNOSIS — J439 Emphysema, unspecified: Secondary | ICD-10-CM | POA: Diagnosis not present

## 2022-05-28 DIAGNOSIS — Z955 Presence of coronary angioplasty implant and graft: Secondary | ICD-10-CM | POA: Diagnosis not present

## 2022-05-28 DIAGNOSIS — F17210 Nicotine dependence, cigarettes, uncomplicated: Secondary | ICD-10-CM | POA: Diagnosis not present

## 2022-05-28 DIAGNOSIS — I700 Atherosclerosis of aorta: Secondary | ICD-10-CM | POA: Insufficient documentation

## 2022-05-28 DIAGNOSIS — Z122 Encounter for screening for malignant neoplasm of respiratory organs: Secondary | ICD-10-CM | POA: Insufficient documentation

## 2022-05-28 DIAGNOSIS — Z720 Tobacco use: Secondary | ICD-10-CM | POA: Insufficient documentation

## 2022-05-28 DIAGNOSIS — I2510 Atherosclerotic heart disease of native coronary artery without angina pectoris: Secondary | ICD-10-CM | POA: Insufficient documentation

## 2022-07-02 ENCOUNTER — Other Ambulatory Visit: Payer: Self-pay | Admitting: Student

## 2022-07-02 ENCOUNTER — Other Ambulatory Visit: Payer: Self-pay | Admitting: Cardiovascular Disease

## 2022-07-17 DIAGNOSIS — Z0000 Encounter for general adult medical examination without abnormal findings: Secondary | ICD-10-CM | POA: Diagnosis not present

## 2022-07-17 DIAGNOSIS — Z299 Encounter for prophylactic measures, unspecified: Secondary | ICD-10-CM | POA: Diagnosis not present

## 2022-07-17 DIAGNOSIS — G5603 Carpal tunnel syndrome, bilateral upper limbs: Secondary | ICD-10-CM | POA: Diagnosis not present

## 2022-07-17 DIAGNOSIS — Z23 Encounter for immunization: Secondary | ICD-10-CM | POA: Diagnosis not present

## 2022-07-17 DIAGNOSIS — I10 Essential (primary) hypertension: Secondary | ICD-10-CM | POA: Diagnosis not present

## 2022-07-17 DIAGNOSIS — Z6824 Body mass index (BMI) 24.0-24.9, adult: Secondary | ICD-10-CM | POA: Diagnosis not present

## 2022-07-17 DIAGNOSIS — I25119 Atherosclerotic heart disease of native coronary artery with unspecified angina pectoris: Secondary | ICD-10-CM | POA: Diagnosis not present

## 2022-07-17 DIAGNOSIS — E1165 Type 2 diabetes mellitus with hyperglycemia: Secondary | ICD-10-CM | POA: Diagnosis not present

## 2022-09-30 DIAGNOSIS — R519 Headache, unspecified: Secondary | ICD-10-CM | POA: Diagnosis not present

## 2022-09-30 DIAGNOSIS — F17210 Nicotine dependence, cigarettes, uncomplicated: Secondary | ICD-10-CM | POA: Diagnosis not present

## 2022-09-30 DIAGNOSIS — Z299 Encounter for prophylactic measures, unspecified: Secondary | ICD-10-CM | POA: Diagnosis not present

## 2022-09-30 DIAGNOSIS — J069 Acute upper respiratory infection, unspecified: Secondary | ICD-10-CM | POA: Diagnosis not present

## 2022-10-09 DIAGNOSIS — I10 Essential (primary) hypertension: Secondary | ICD-10-CM | POA: Diagnosis not present

## 2022-10-09 DIAGNOSIS — J449 Chronic obstructive pulmonary disease, unspecified: Secondary | ICD-10-CM | POA: Diagnosis not present

## 2022-10-25 DIAGNOSIS — I10 Essential (primary) hypertension: Secondary | ICD-10-CM | POA: Diagnosis not present

## 2022-10-25 DIAGNOSIS — J449 Chronic obstructive pulmonary disease, unspecified: Secondary | ICD-10-CM | POA: Diagnosis not present

## 2022-10-25 DIAGNOSIS — Z299 Encounter for prophylactic measures, unspecified: Secondary | ICD-10-CM | POA: Diagnosis not present

## 2022-10-25 DIAGNOSIS — E1142 Type 2 diabetes mellitus with diabetic polyneuropathy: Secondary | ICD-10-CM | POA: Diagnosis not present

## 2022-10-25 DIAGNOSIS — R42 Dizziness and giddiness: Secondary | ICD-10-CM | POA: Diagnosis not present

## 2022-10-25 DIAGNOSIS — F17210 Nicotine dependence, cigarettes, uncomplicated: Secondary | ICD-10-CM | POA: Diagnosis not present

## 2022-10-25 DIAGNOSIS — E1165 Type 2 diabetes mellitus with hyperglycemia: Secondary | ICD-10-CM | POA: Diagnosis not present

## 2022-10-25 DIAGNOSIS — Z6824 Body mass index (BMI) 24.0-24.9, adult: Secondary | ICD-10-CM | POA: Diagnosis not present

## 2022-12-05 DIAGNOSIS — Z79899 Other long term (current) drug therapy: Secondary | ICD-10-CM | POA: Diagnosis not present

## 2022-12-05 DIAGNOSIS — I10 Essential (primary) hypertension: Secondary | ICD-10-CM | POA: Diagnosis not present

## 2022-12-05 DIAGNOSIS — Z955 Presence of coronary angioplasty implant and graft: Secondary | ICD-10-CM | POA: Diagnosis not present

## 2022-12-05 DIAGNOSIS — R001 Bradycardia, unspecified: Secondary | ICD-10-CM | POA: Diagnosis not present

## 2022-12-05 DIAGNOSIS — Z7982 Long term (current) use of aspirin: Secondary | ICD-10-CM | POA: Diagnosis not present

## 2022-12-05 DIAGNOSIS — K219 Gastro-esophageal reflux disease without esophagitis: Secondary | ICD-10-CM | POA: Diagnosis not present

## 2022-12-05 DIAGNOSIS — Z7902 Long term (current) use of antithrombotics/antiplatelets: Secondary | ICD-10-CM | POA: Diagnosis not present

## 2022-12-05 DIAGNOSIS — J439 Emphysema, unspecified: Secondary | ICD-10-CM | POA: Diagnosis not present

## 2022-12-05 DIAGNOSIS — I25119 Atherosclerotic heart disease of native coronary artery with unspecified angina pectoris: Secondary | ICD-10-CM | POA: Diagnosis not present

## 2022-12-05 DIAGNOSIS — J449 Chronic obstructive pulmonary disease, unspecified: Secondary | ICD-10-CM | POA: Diagnosis not present

## 2022-12-05 DIAGNOSIS — E785 Hyperlipidemia, unspecified: Secondary | ICD-10-CM | POA: Diagnosis not present

## 2022-12-05 DIAGNOSIS — I25118 Atherosclerotic heart disease of native coronary artery with other forms of angina pectoris: Secondary | ICD-10-CM | POA: Diagnosis not present

## 2022-12-05 DIAGNOSIS — E119 Type 2 diabetes mellitus without complications: Secondary | ICD-10-CM | POA: Diagnosis not present

## 2022-12-05 DIAGNOSIS — F17210 Nicotine dependence, cigarettes, uncomplicated: Secondary | ICD-10-CM | POA: Diagnosis not present

## 2022-12-05 DIAGNOSIS — I2089 Other forms of angina pectoris: Secondary | ICD-10-CM | POA: Diagnosis not present

## 2022-12-05 DIAGNOSIS — R079 Chest pain, unspecified: Secondary | ICD-10-CM | POA: Diagnosis not present

## 2022-12-05 DIAGNOSIS — R0789 Other chest pain: Secondary | ICD-10-CM | POA: Diagnosis not present

## 2022-12-05 DIAGNOSIS — Z7984 Long term (current) use of oral hypoglycemic drugs: Secondary | ICD-10-CM | POA: Diagnosis not present

## 2022-12-05 DIAGNOSIS — R072 Precordial pain: Secondary | ICD-10-CM | POA: Diagnosis not present

## 2022-12-09 DIAGNOSIS — Z6824 Body mass index (BMI) 24.0-24.9, adult: Secondary | ICD-10-CM | POA: Diagnosis not present

## 2022-12-09 DIAGNOSIS — D692 Other nonthrombocytopenic purpura: Secondary | ICD-10-CM | POA: Diagnosis not present

## 2022-12-09 DIAGNOSIS — Z299 Encounter for prophylactic measures, unspecified: Secondary | ICD-10-CM | POA: Diagnosis not present

## 2022-12-09 DIAGNOSIS — Z0000 Encounter for general adult medical examination without abnormal findings: Secondary | ICD-10-CM | POA: Diagnosis not present

## 2022-12-09 DIAGNOSIS — E7800 Pure hypercholesterolemia, unspecified: Secondary | ICD-10-CM | POA: Diagnosis not present

## 2022-12-09 DIAGNOSIS — I10 Essential (primary) hypertension: Secondary | ICD-10-CM | POA: Diagnosis not present

## 2022-12-09 DIAGNOSIS — I25119 Atherosclerotic heart disease of native coronary artery with unspecified angina pectoris: Secondary | ICD-10-CM | POA: Diagnosis not present

## 2022-12-09 DIAGNOSIS — Z7189 Other specified counseling: Secondary | ICD-10-CM | POA: Diagnosis not present

## 2022-12-27 DIAGNOSIS — I10 Essential (primary) hypertension: Secondary | ICD-10-CM | POA: Diagnosis not present

## 2022-12-27 DIAGNOSIS — Z299 Encounter for prophylactic measures, unspecified: Secondary | ICD-10-CM | POA: Diagnosis not present

## 2022-12-27 DIAGNOSIS — F17210 Nicotine dependence, cigarettes, uncomplicated: Secondary | ICD-10-CM | POA: Diagnosis not present

## 2022-12-27 DIAGNOSIS — M48062 Spinal stenosis, lumbar region with neurogenic claudication: Secondary | ICD-10-CM | POA: Diagnosis not present

## 2022-12-27 DIAGNOSIS — E1142 Type 2 diabetes mellitus with diabetic polyneuropathy: Secondary | ICD-10-CM | POA: Diagnosis not present

## 2022-12-30 DIAGNOSIS — I25118 Atherosclerotic heart disease of native coronary artery with other forms of angina pectoris: Secondary | ICD-10-CM | POA: Diagnosis not present

## 2022-12-30 DIAGNOSIS — I4821 Permanent atrial fibrillation: Secondary | ICD-10-CM | POA: Diagnosis not present

## 2022-12-30 DIAGNOSIS — I10 Essential (primary) hypertension: Secondary | ICD-10-CM | POA: Diagnosis not present

## 2022-12-30 DIAGNOSIS — E782 Mixed hyperlipidemia: Secondary | ICD-10-CM | POA: Diagnosis not present

## 2022-12-31 DIAGNOSIS — Z299 Encounter for prophylactic measures, unspecified: Secondary | ICD-10-CM | POA: Diagnosis not present

## 2022-12-31 DIAGNOSIS — F17210 Nicotine dependence, cigarettes, uncomplicated: Secondary | ICD-10-CM | POA: Diagnosis not present

## 2022-12-31 DIAGNOSIS — M5417 Radiculopathy, lumbosacral region: Secondary | ICD-10-CM | POA: Diagnosis not present

## 2022-12-31 DIAGNOSIS — M4807 Spinal stenosis, lumbosacral region: Secondary | ICD-10-CM | POA: Diagnosis not present

## 2022-12-31 DIAGNOSIS — I10 Essential (primary) hypertension: Secondary | ICD-10-CM | POA: Diagnosis not present

## 2023-01-05 DIAGNOSIS — Z6824 Body mass index (BMI) 24.0-24.9, adult: Secondary | ICD-10-CM | POA: Diagnosis not present

## 2023-01-05 DIAGNOSIS — M545 Low back pain, unspecified: Secondary | ICD-10-CM | POA: Diagnosis not present

## 2023-01-06 DIAGNOSIS — F17210 Nicotine dependence, cigarettes, uncomplicated: Secondary | ICD-10-CM | POA: Diagnosis not present

## 2023-01-06 DIAGNOSIS — M533 Sacrococcygeal disorders, not elsewhere classified: Secondary | ICD-10-CM | POA: Diagnosis not present

## 2023-01-06 DIAGNOSIS — I10 Essential (primary) hypertension: Secondary | ICD-10-CM | POA: Diagnosis not present

## 2023-01-06 DIAGNOSIS — E119 Type 2 diabetes mellitus without complications: Secondary | ICD-10-CM | POA: Diagnosis not present

## 2023-01-06 DIAGNOSIS — E785 Hyperlipidemia, unspecified: Secondary | ICD-10-CM | POA: Diagnosis not present

## 2023-01-22 DIAGNOSIS — I10 Essential (primary) hypertension: Secondary | ICD-10-CM | POA: Diagnosis not present

## 2023-01-22 DIAGNOSIS — M5417 Radiculopathy, lumbosacral region: Secondary | ICD-10-CM | POA: Diagnosis not present

## 2023-01-22 DIAGNOSIS — M4807 Spinal stenosis, lumbosacral region: Secondary | ICD-10-CM | POA: Diagnosis not present

## 2023-01-22 DIAGNOSIS — I25119 Atherosclerotic heart disease of native coronary artery with unspecified angina pectoris: Secondary | ICD-10-CM | POA: Diagnosis not present

## 2023-01-22 DIAGNOSIS — Z299 Encounter for prophylactic measures, unspecified: Secondary | ICD-10-CM | POA: Diagnosis not present

## 2023-01-28 DIAGNOSIS — R110 Nausea: Secondary | ICD-10-CM | POA: Diagnosis not present

## 2023-01-28 DIAGNOSIS — L309 Dermatitis, unspecified: Secondary | ICD-10-CM | POA: Diagnosis not present

## 2023-01-28 DIAGNOSIS — I10 Essential (primary) hypertension: Secondary | ICD-10-CM | POA: Diagnosis not present

## 2023-01-28 DIAGNOSIS — Z299 Encounter for prophylactic measures, unspecified: Secondary | ICD-10-CM | POA: Diagnosis not present

## 2023-01-28 DIAGNOSIS — K2970 Gastritis, unspecified, without bleeding: Secondary | ICD-10-CM | POA: Diagnosis not present

## 2023-03-27 DIAGNOSIS — Z0000 Encounter for general adult medical examination without abnormal findings: Secondary | ICD-10-CM | POA: Diagnosis not present

## 2023-03-27 DIAGNOSIS — I10 Essential (primary) hypertension: Secondary | ICD-10-CM | POA: Diagnosis not present

## 2023-03-27 DIAGNOSIS — E1165 Type 2 diabetes mellitus with hyperglycemia: Secondary | ICD-10-CM | POA: Diagnosis not present

## 2023-03-27 DIAGNOSIS — I700 Atherosclerosis of aorta: Secondary | ICD-10-CM | POA: Diagnosis not present

## 2023-03-27 DIAGNOSIS — J449 Chronic obstructive pulmonary disease, unspecified: Secondary | ICD-10-CM | POA: Diagnosis not present

## 2023-03-27 DIAGNOSIS — Z299 Encounter for prophylactic measures, unspecified: Secondary | ICD-10-CM | POA: Diagnosis not present

## 2023-03-27 DIAGNOSIS — E1142 Type 2 diabetes mellitus with diabetic polyneuropathy: Secondary | ICD-10-CM | POA: Diagnosis not present

## 2023-03-28 DIAGNOSIS — E7800 Pure hypercholesterolemia, unspecified: Secondary | ICD-10-CM | POA: Diagnosis not present

## 2023-03-28 DIAGNOSIS — Z79899 Other long term (current) drug therapy: Secondary | ICD-10-CM | POA: Diagnosis not present

## 2023-03-28 DIAGNOSIS — R5383 Other fatigue: Secondary | ICD-10-CM | POA: Diagnosis not present

## 2023-04-28 DIAGNOSIS — E7800 Pure hypercholesterolemia, unspecified: Secondary | ICD-10-CM | POA: Diagnosis not present

## 2023-04-28 DIAGNOSIS — Z299 Encounter for prophylactic measures, unspecified: Secondary | ICD-10-CM | POA: Diagnosis not present

## 2023-04-28 DIAGNOSIS — I10 Essential (primary) hypertension: Secondary | ICD-10-CM | POA: Diagnosis not present

## 2023-04-30 ENCOUNTER — Ambulatory Visit: Payer: Medicare Other | Admitting: Physician Assistant

## 2023-05-16 DIAGNOSIS — G5603 Carpal tunnel syndrome, bilateral upper limbs: Secondary | ICD-10-CM | POA: Diagnosis not present

## 2023-05-16 DIAGNOSIS — I10 Essential (primary) hypertension: Secondary | ICD-10-CM | POA: Diagnosis not present

## 2023-05-16 DIAGNOSIS — Z299 Encounter for prophylactic measures, unspecified: Secondary | ICD-10-CM | POA: Diagnosis not present

## 2023-06-09 DIAGNOSIS — I10 Essential (primary) hypertension: Secondary | ICD-10-CM | POA: Diagnosis not present

## 2023-06-09 DIAGNOSIS — R42 Dizziness and giddiness: Secondary | ICD-10-CM | POA: Diagnosis not present

## 2023-06-09 DIAGNOSIS — Z299 Encounter for prophylactic measures, unspecified: Secondary | ICD-10-CM | POA: Diagnosis not present

## 2023-06-09 DIAGNOSIS — Z23 Encounter for immunization: Secondary | ICD-10-CM | POA: Diagnosis not present

## 2023-07-02 ENCOUNTER — Other Ambulatory Visit: Payer: Self-pay | Admitting: Cardiovascular Disease

## 2023-07-02 DIAGNOSIS — E1142 Type 2 diabetes mellitus with diabetic polyneuropathy: Secondary | ICD-10-CM | POA: Diagnosis not present

## 2023-07-02 DIAGNOSIS — G5602 Carpal tunnel syndrome, left upper limb: Secondary | ICD-10-CM | POA: Diagnosis not present

## 2023-07-02 DIAGNOSIS — J449 Chronic obstructive pulmonary disease, unspecified: Secondary | ICD-10-CM | POA: Diagnosis not present

## 2023-07-02 DIAGNOSIS — I10 Essential (primary) hypertension: Secondary | ICD-10-CM | POA: Diagnosis not present

## 2023-07-02 DIAGNOSIS — Z299 Encounter for prophylactic measures, unspecified: Secondary | ICD-10-CM | POA: Diagnosis not present

## 2023-07-02 DIAGNOSIS — E1169 Type 2 diabetes mellitus with other specified complication: Secondary | ICD-10-CM | POA: Diagnosis not present

## 2023-07-03 ENCOUNTER — Other Ambulatory Visit: Payer: Self-pay | Admitting: Cardiovascular Disease

## 2023-07-23 ENCOUNTER — Other Ambulatory Visit: Payer: Self-pay | Admitting: Cardiovascular Disease

## 2023-08-08 ENCOUNTER — Other Ambulatory Visit: Payer: Self-pay | Admitting: Cardiovascular Disease

## 2023-08-25 DIAGNOSIS — I10 Essential (primary) hypertension: Secondary | ICD-10-CM | POA: Diagnosis not present

## 2023-08-25 DIAGNOSIS — F17210 Nicotine dependence, cigarettes, uncomplicated: Secondary | ICD-10-CM | POA: Diagnosis not present

## 2023-08-25 DIAGNOSIS — E1169 Type 2 diabetes mellitus with other specified complication: Secondary | ICD-10-CM | POA: Diagnosis not present

## 2023-08-25 DIAGNOSIS — M25512 Pain in left shoulder: Secondary | ICD-10-CM | POA: Diagnosis not present

## 2023-08-25 DIAGNOSIS — Z299 Encounter for prophylactic measures, unspecified: Secondary | ICD-10-CM | POA: Diagnosis not present

## 2023-08-25 DIAGNOSIS — J449 Chronic obstructive pulmonary disease, unspecified: Secondary | ICD-10-CM | POA: Diagnosis not present

## 2023-08-25 DIAGNOSIS — R52 Pain, unspecified: Secondary | ICD-10-CM | POA: Diagnosis not present

## 2023-09-09 DIAGNOSIS — I10 Essential (primary) hypertension: Secondary | ICD-10-CM | POA: Diagnosis not present

## 2023-09-09 DIAGNOSIS — E1169 Type 2 diabetes mellitus with other specified complication: Secondary | ICD-10-CM | POA: Diagnosis not present

## 2023-09-09 DIAGNOSIS — F17210 Nicotine dependence, cigarettes, uncomplicated: Secondary | ICD-10-CM | POA: Diagnosis not present

## 2023-09-09 DIAGNOSIS — Z299 Encounter for prophylactic measures, unspecified: Secondary | ICD-10-CM | POA: Diagnosis not present

## 2023-09-09 DIAGNOSIS — G5602 Carpal tunnel syndrome, left upper limb: Secondary | ICD-10-CM | POA: Diagnosis not present

## 2023-10-20 ENCOUNTER — Telehealth: Payer: Self-pay | Admitting: Diagnostic Neuroimaging

## 2023-10-20 NOTE — Telephone Encounter (Signed)
 rs appointment

## 2023-10-22 ENCOUNTER — Ambulatory Visit: Payer: Medicare Other | Admitting: Diagnostic Neuroimaging

## 2023-10-31 DIAGNOSIS — R52 Pain, unspecified: Secondary | ICD-10-CM | POA: Diagnosis not present

## 2023-10-31 DIAGNOSIS — I10 Essential (primary) hypertension: Secondary | ICD-10-CM | POA: Diagnosis not present

## 2023-10-31 DIAGNOSIS — E1169 Type 2 diabetes mellitus with other specified complication: Secondary | ICD-10-CM | POA: Diagnosis not present

## 2023-10-31 DIAGNOSIS — F17210 Nicotine dependence, cigarettes, uncomplicated: Secondary | ICD-10-CM | POA: Diagnosis not present

## 2023-10-31 DIAGNOSIS — Z299 Encounter for prophylactic measures, unspecified: Secondary | ICD-10-CM | POA: Diagnosis not present

## 2023-11-10 DIAGNOSIS — Z299 Encounter for prophylactic measures, unspecified: Secondary | ICD-10-CM | POA: Diagnosis not present

## 2023-11-10 DIAGNOSIS — I10 Essential (primary) hypertension: Secondary | ICD-10-CM | POA: Diagnosis not present

## 2023-11-10 DIAGNOSIS — G5602 Carpal tunnel syndrome, left upper limb: Secondary | ICD-10-CM | POA: Diagnosis not present

## 2023-12-02 DIAGNOSIS — M549 Dorsalgia, unspecified: Secondary | ICD-10-CM | POA: Diagnosis not present

## 2023-12-02 DIAGNOSIS — I700 Atherosclerosis of aorta: Secondary | ICD-10-CM | POA: Diagnosis not present

## 2023-12-02 DIAGNOSIS — Z299 Encounter for prophylactic measures, unspecified: Secondary | ICD-10-CM | POA: Diagnosis not present

## 2023-12-02 DIAGNOSIS — M4800 Spinal stenosis, site unspecified: Secondary | ICD-10-CM | POA: Diagnosis not present

## 2023-12-02 DIAGNOSIS — R52 Pain, unspecified: Secondary | ICD-10-CM | POA: Diagnosis not present

## 2023-12-02 DIAGNOSIS — I10 Essential (primary) hypertension: Secondary | ICD-10-CM | POA: Diagnosis not present

## 2023-12-02 DIAGNOSIS — E1169 Type 2 diabetes mellitus with other specified complication: Secondary | ICD-10-CM | POA: Diagnosis not present

## 2023-12-16 DIAGNOSIS — R52 Pain, unspecified: Secondary | ICD-10-CM | POA: Diagnosis not present

## 2023-12-16 DIAGNOSIS — E1142 Type 2 diabetes mellitus with diabetic polyneuropathy: Secondary | ICD-10-CM | POA: Diagnosis not present

## 2023-12-16 DIAGNOSIS — Z299 Encounter for prophylactic measures, unspecified: Secondary | ICD-10-CM | POA: Diagnosis not present

## 2023-12-16 DIAGNOSIS — Z0000 Encounter for general adult medical examination without abnormal findings: Secondary | ICD-10-CM | POA: Diagnosis not present

## 2023-12-16 DIAGNOSIS — I10 Essential (primary) hypertension: Secondary | ICD-10-CM | POA: Diagnosis not present

## 2023-12-16 DIAGNOSIS — J449 Chronic obstructive pulmonary disease, unspecified: Secondary | ICD-10-CM | POA: Diagnosis not present

## 2024-01-06 DIAGNOSIS — I25118 Atherosclerotic heart disease of native coronary artery with other forms of angina pectoris: Secondary | ICD-10-CM | POA: Diagnosis not present

## 2024-01-06 DIAGNOSIS — E782 Mixed hyperlipidemia: Secondary | ICD-10-CM | POA: Diagnosis not present

## 2024-01-06 DIAGNOSIS — I10 Essential (primary) hypertension: Secondary | ICD-10-CM | POA: Diagnosis not present

## 2024-02-03 DIAGNOSIS — E1169 Type 2 diabetes mellitus with other specified complication: Secondary | ICD-10-CM | POA: Diagnosis not present

## 2024-02-03 DIAGNOSIS — I10 Essential (primary) hypertension: Secondary | ICD-10-CM | POA: Diagnosis not present

## 2024-02-03 DIAGNOSIS — J449 Chronic obstructive pulmonary disease, unspecified: Secondary | ICD-10-CM | POA: Diagnosis not present

## 2024-02-03 DIAGNOSIS — I700 Atherosclerosis of aorta: Secondary | ICD-10-CM | POA: Diagnosis not present

## 2024-02-03 DIAGNOSIS — Z299 Encounter for prophylactic measures, unspecified: Secondary | ICD-10-CM | POA: Diagnosis not present

## 2024-02-13 DIAGNOSIS — Z79899 Other long term (current) drug therapy: Secondary | ICD-10-CM | POA: Diagnosis not present

## 2024-02-13 DIAGNOSIS — E7800 Pure hypercholesterolemia, unspecified: Secondary | ICD-10-CM | POA: Diagnosis not present

## 2024-02-13 DIAGNOSIS — F17210 Nicotine dependence, cigarettes, uncomplicated: Secondary | ICD-10-CM | POA: Diagnosis not present

## 2024-02-13 DIAGNOSIS — I10 Essential (primary) hypertension: Secondary | ICD-10-CM | POA: Diagnosis not present

## 2024-02-13 DIAGNOSIS — Z0000 Encounter for general adult medical examination without abnormal findings: Secondary | ICD-10-CM | POA: Diagnosis not present

## 2024-02-13 DIAGNOSIS — R5383 Other fatigue: Secondary | ICD-10-CM | POA: Diagnosis not present

## 2024-02-13 DIAGNOSIS — Z299 Encounter for prophylactic measures, unspecified: Secondary | ICD-10-CM | POA: Diagnosis not present

## 2024-02-13 DIAGNOSIS — Z7189 Other specified counseling: Secondary | ICD-10-CM | POA: Diagnosis not present

## 2024-02-16 DIAGNOSIS — E7800 Pure hypercholesterolemia, unspecified: Secondary | ICD-10-CM | POA: Diagnosis not present

## 2024-02-16 DIAGNOSIS — Z79899 Other long term (current) drug therapy: Secondary | ICD-10-CM | POA: Diagnosis not present

## 2024-02-16 DIAGNOSIS — R5383 Other fatigue: Secondary | ICD-10-CM | POA: Diagnosis not present

## 2024-03-22 DIAGNOSIS — M4800 Spinal stenosis, site unspecified: Secondary | ICD-10-CM | POA: Diagnosis not present

## 2024-03-22 DIAGNOSIS — R42 Dizziness and giddiness: Secondary | ICD-10-CM | POA: Diagnosis not present

## 2024-03-22 DIAGNOSIS — Z299 Encounter for prophylactic measures, unspecified: Secondary | ICD-10-CM | POA: Diagnosis not present

## 2024-03-22 DIAGNOSIS — E1169 Type 2 diabetes mellitus with other specified complication: Secondary | ICD-10-CM | POA: Diagnosis not present

## 2024-03-22 DIAGNOSIS — J449 Chronic obstructive pulmonary disease, unspecified: Secondary | ICD-10-CM | POA: Diagnosis not present

## 2024-03-29 DIAGNOSIS — R42 Dizziness and giddiness: Secondary | ICD-10-CM | POA: Diagnosis not present

## 2024-04-01 DIAGNOSIS — E1142 Type 2 diabetes mellitus with diabetic polyneuropathy: Secondary | ICD-10-CM | POA: Diagnosis not present

## 2024-04-01 DIAGNOSIS — I10 Essential (primary) hypertension: Secondary | ICD-10-CM | POA: Diagnosis not present

## 2024-04-01 DIAGNOSIS — Z299 Encounter for prophylactic measures, unspecified: Secondary | ICD-10-CM | POA: Diagnosis not present

## 2024-04-16 DIAGNOSIS — M9902 Segmental and somatic dysfunction of thoracic region: Secondary | ICD-10-CM | POA: Diagnosis not present

## 2024-04-16 DIAGNOSIS — M5416 Radiculopathy, lumbar region: Secondary | ICD-10-CM | POA: Diagnosis not present

## 2024-04-16 DIAGNOSIS — M9905 Segmental and somatic dysfunction of pelvic region: Secondary | ICD-10-CM | POA: Diagnosis not present

## 2024-04-16 DIAGNOSIS — M9903 Segmental and somatic dysfunction of lumbar region: Secondary | ICD-10-CM | POA: Diagnosis not present

## 2024-04-21 DIAGNOSIS — G5602 Carpal tunnel syndrome, left upper limb: Secondary | ICD-10-CM | POA: Diagnosis not present

## 2024-04-21 DIAGNOSIS — R52 Pain, unspecified: Secondary | ICD-10-CM | POA: Diagnosis not present

## 2024-04-21 DIAGNOSIS — Z299 Encounter for prophylactic measures, unspecified: Secondary | ICD-10-CM | POA: Diagnosis not present

## 2024-04-21 DIAGNOSIS — E1169 Type 2 diabetes mellitus with other specified complication: Secondary | ICD-10-CM | POA: Diagnosis not present

## 2024-04-21 DIAGNOSIS — J439 Emphysema, unspecified: Secondary | ICD-10-CM | POA: Diagnosis not present

## 2024-04-21 DIAGNOSIS — I10 Essential (primary) hypertension: Secondary | ICD-10-CM | POA: Diagnosis not present

## 2024-04-21 DIAGNOSIS — J449 Chronic obstructive pulmonary disease, unspecified: Secondary | ICD-10-CM | POA: Diagnosis not present

## 2024-05-04 DIAGNOSIS — R52 Pain, unspecified: Secondary | ICD-10-CM | POA: Diagnosis not present

## 2024-05-04 DIAGNOSIS — Z299 Encounter for prophylactic measures, unspecified: Secondary | ICD-10-CM | POA: Diagnosis not present

## 2024-05-04 DIAGNOSIS — I10 Essential (primary) hypertension: Secondary | ICD-10-CM | POA: Diagnosis not present

## 2024-05-04 DIAGNOSIS — M4800 Spinal stenosis, site unspecified: Secondary | ICD-10-CM | POA: Diagnosis not present

## 2024-05-04 DIAGNOSIS — E119 Type 2 diabetes mellitus without complications: Secondary | ICD-10-CM | POA: Diagnosis not present

## 2024-05-04 DIAGNOSIS — J449 Chronic obstructive pulmonary disease, unspecified: Secondary | ICD-10-CM | POA: Diagnosis not present

## 2024-05-25 DIAGNOSIS — Z23 Encounter for immunization: Secondary | ICD-10-CM | POA: Diagnosis not present

## 2024-06-24 DIAGNOSIS — M549 Dorsalgia, unspecified: Secondary | ICD-10-CM | POA: Diagnosis not present

## 2024-06-24 DIAGNOSIS — Z299 Encounter for prophylactic measures, unspecified: Secondary | ICD-10-CM | POA: Diagnosis not present

## 2024-06-24 DIAGNOSIS — R52 Pain, unspecified: Secondary | ICD-10-CM | POA: Diagnosis not present

## 2024-06-24 DIAGNOSIS — I10 Essential (primary) hypertension: Secondary | ICD-10-CM | POA: Diagnosis not present

## 2024-06-24 DIAGNOSIS — N1832 Chronic kidney disease, stage 3b: Secondary | ICD-10-CM | POA: Diagnosis not present

## 2024-06-24 DIAGNOSIS — J449 Chronic obstructive pulmonary disease, unspecified: Secondary | ICD-10-CM | POA: Diagnosis not present

## 2024-07-10 ENCOUNTER — Encounter (HOSPITAL_COMMUNITY): Payer: Self-pay

## 2024-07-26 NOTE — Progress Notes (Addendum)
 Assessment/Plan:    Syncope  - Has had multiple episodes of syncope and near syncope.  Urgent care and ED who placed a Zio patch monitor both on revealing for any conduction blocks bradycardia but a few episodes of SVT. - Patient drives a truck for living and will refer to EP as such for evaluation for potential need for longer-term monitor (ILR) to capture any arrhythmia or pause or to proceed directly to permanent pacemaker.  Patient advised not to drive a truck until evaluated by EP.  CAD with stable angina pectoris - Denies any current symptoms on medical therapy (since adding Imdur ); moderately active - Previous mid LAD stent 2021 - Cardiac catheterization in June 2023 with mild nonobstructive disease and patent stent - Continue with medical therapy with aspirin , Crestor , metoprolol , amlodipine  and Imdur  - Smoking cessation discussed with patient (see below)  Essential hypertension - Elevated today, will increase amlodipine  to 5 mg daily - could increase lisinopril hydrochlorothiazide if needed, he will monitor at home - Follow low salt diet and avoid daily NSAID use  COPD/smoker - 62-pack-year history of smoking.  Current smoker. - Highly advised cessation; counseled, patient voices understanding and agreement   Hyperlipidemia - Continue Crestor  40 mg at bedtime - Repeat panel at discretion of PCP  Type II DM - Managed by his PCP  Return after seeing EP or as needed  Ozell Stands, DO  Subjective:   Patient ID: Richard Rocha is a 83 y.o.male.  Pleasant 83 year old gentleman evaluated at request of his primary care physician in regards to management of cardiovascular disease.  He has past medical history of essential hypertension, hyperlipidemia, atherosclerotic coronary artery disease without prior myocardial infarction and previous placement of drug-eluting stent to LAD in July 2021, COPD/emphysema as well as ongoing smoking, Schatzki's ring as well as hiatal  hernia and chronic gastritis.    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  He is taking his medications as directed and tolerating well.  Blood pressure is well-controlled.  He is no longer on Plavix  but tolerating low-dose aspirin  daily well without side effects.  07/26/24: Had recent admission to [facility] 07/10/2024 for lightheadedness and dizziness.  Heart rate was in the 50s upon admission to urgent care and was sent to the ED for further evaluation where they placed a Zio patch monitor on which showed no conduction blocks, bradycardia just a few episodes of SVT.  Patient states that he continues to have symptoms and has near syncope and syncopal episodes.  He drives a truck for a living and is concerned if he should drive or not.  Denies chest pain shortness of breath lower extremity edema.  The following portions of the patient's history were reviewed and updated as appropriate: allergies, current medications, past family history, past medical history, past social history, past surgical history, and problem list.  Review of Systems  Constitutional: Negative.   HENT: Negative.    Eyes: Negative.   Respiratory: Negative.    Cardiovascular: Negative.   Gastrointestinal: Negative.   Endocrine: Negative.   Genitourinary: Negative.   Musculoskeletal: Negative.   Skin: Negative.   Allergic/Immunologic: Negative.   Neurological: Negative.   Hematological: Negative.   Psychiatric/Behavioral: Negative.        Objective:     Vitals:   01/06/24 1039  Pulse: 51  Resp: 16  Temp: 37.1 C (98.8 F)  SpO2: 99%   Wt Readings from Last 4  Encounters:  01/06/24 69.9 kg (154 lb)  01/06/23 71.3 kg (157 lb 3.2 oz)  12/30/22 71.4 kg (157 lb 6.4 oz)  12/05/22 71 kg (156 lb 8 oz)   Physical Exam Vitals reviewed.  Constitutional:      Appearance: Normal appearance.  HENT:     Head: Normocephalic and  atraumatic.     Mouth/Throat:     Mouth: Mucous membranes are moist.  Eyes:     Extraocular Movements: Extraocular movements intact.     Pupils: Pupils are equal, round, and reactive to light.  Neck:     Vascular: No carotid bruit.  Cardiovascular:     Rate and Rhythm: Normal rate and regular rhythm.     Pulses: Normal pulses.     Heart sounds: Normal heart sounds. No murmur heard. Pulmonary:     Effort: Pulmonary effort is normal.     Breath sounds: Normal breath sounds.  Abdominal:     General: Bowel sounds are normal.     Palpations: Abdomen is soft.     Tenderness: There is no guarding or rebound.  Musculoskeletal:     Cervical back: Neck supple.     Right lower leg: No edema.     Left lower leg: No edema.  Skin:    General: Skin is warm and dry.  Neurological:     General: No focal deficit present.     Mental Status: He is alert and oriented to person, place, and time.  Psychiatric:        Mood and Affect: Mood normal.        Behavior: Behavior normal.      Medications:   Current Outpatient Medications  Medication Instructions  . amlodipine  (NORVASC ) 2.5 mg, Daily (standard)  . aspirin  (ECOTRIN) 81 mg  . cetirizine (ZYRTEC) 10 mg, Daily (standard)  . gabapentin (NEURONTIN) 100 mg, Nightly  . isosorbide  mononitrate (IMDUR ) 30 mg, Oral, Daily (standard)  . lisinopril-hydroCHLOROthiazide (PRINZIDE,ZESTORETIC) 20-12.5 mg per tablet 1 tablet, Daily (standard)  . meclizine  (ANTIVERT ) 25 mg tablet TAKE ONE TABLET BY MOUTH THREE TIMES DAILY AS NEEDED FOR DIZZINESS  . meloxicam (MOBIC) 15 MG tablet 1 tablet, Daily (standard)  . metFORMIN  (GLUCOPHAGE ) 500 MG tablet Take 2 tablets (1,000 mg total) by mouth in the morning and 2 tablets (1,000 mg total) in the evening. Take with meals.  . methylPREDNISolone  (MEDROL  DOSEPACK) 4 mg  . mv-min-folic-K1-lycopen-lutein (CENTRUM MINIS MEN 50 PLUS) 150-30-300-150 mcg Tab 1 tablet, Daily  . nitroglycerin  (NITROSTAT ) 0.4 MG SL tablet  DISSOLVE ONE TABLET UNDER TONGUE EVERY 5 MINUTES UP TO 3 DOSES AS NEEDED FOR CHEST PAIN  . pantoprazole  (PROTONIX ) 40 mg, 2 times a day (standard)  . rosuvastatin  (CRESTOR ) 40 mg, Daily (standard)  . tamsulosin (FLOMAX) 0.4 mg, Daily (standard)  . triamcinolone (KENALOG) 0.1 % cream APPLY TO THE AFFECTED AREA DAILY AS DIRECTED    Procedures:   ECG 07/26/2024: Sinus rhythm, incomplete right bundle branch block   Echo: 02/2022 IMPRESSIONS   1. Left ventricular ejection fraction, by estimation, is 55 to 60%. The left ventricle has normal function. The left ventricle has no regional wall motion abnormalities. There is mild asymmetric left ventricular hypertrophy of the basal segment. Left  ventricular diastolic parameters are consistent with Grade I diastolic dysfunction (impaired relaxation). The average left ventricular global longitudinal strain is -22.3 %. The global longitudinal strain is normal.   2. Right ventricular systolic function is normal. The right ventricular size is normal. There is normal pulmonary  artery systolic pressure. The estimated right ventricular systolic pressure is 24.0 mmHg.   3. The mitral valve is grossly normal. Trivial mitral valve regurgitation.   4. The aortic valve is tricuspid. Aortic valve regurgitation is not visualized.   5. The inferior vena cava is normal in size with greater than 50% respiratory variability, suggesting right atrial pressure of 3 mmHg.   Coronary CT:  None  Stress test:  None since heart Cath 2023  Heart Cath:  02/12/22 Narrative .  Ost LAD to Prox LAD lesion is 30% stenosed. SABRA Fraise LAD lesion is 40% stenosed. .  3rd Mrg lesion is 30% stenosed. .  Non-stenotic Mid LAD lesion was previously treated. .  The left ventricular systolic function is normal. .  LV end diastolic pressure is normal. .  The left ventricular ejection fraction is 55-65% by visual estimate. Nonobstructive CAD Normal LV function Normal LVEDP  CMR:   None  Device check:  N/A  Zio 07/22/2024: Conclusions: - Ambulatory ECG monitoring was performed from 07/10/24 to 07/17/24. - The predominant rhythm was sinus rhythm, with the rate ranging from 40 to 88 and averaging 58 bpm when in sinus rhythm - Rare supraventricular ectopics (PACs) were recorded with 19 episodes of SVT, longest lasting 11 beats at 127 bpm - Rare ventricular ectopics (PVCs) were recorded with no recorded episodes of wide-complex tachycardia - No patient-initiated recordings/events were submitted. - No pauses >3 seconds or high-grade AV block detected - No atrial fibrillation detected    Follow up:   No follow-ups on file.

## 2024-07-30 ENCOUNTER — Telehealth: Payer: Self-pay

## 2024-07-30 NOTE — Telephone Encounter (Signed)
 Patient called the [facility] [place]. Patient stated he has a referral with our program. Patient does not meet the requirements for our program due to having Medicare insurance. Attempted the call the [facility] facility in [facility], left a voicemail for the practice to return our call for clarification of the referral

## 2024-09-15 NOTE — Progress Notes (Signed)
 " Electrophysiology Office Note:    Date:  09/16/2024   ID:  Rocha, Kalter 1940-10-05, MRN [id number]  PCP:  Maree Isles, MD   [facility] Providers Cardiologist:  Maude Emmer, MD     Referring MD: Lannette Sharper   History of Present Illness:    Richard Rocha is a 84 y.o. male with a medical history significant for recurrent syncope, coronary disease s/p LAD stent, hypertension, referred for evaluation of recurrent syncope.      Discussed the use of AI scribe software for clinical note transcription with the patient, who gave verbal consent to proceed.  History of Present Illness Richard Rocha is an 84 year old male who presents with episodes of dizziness and near-syncope. He was referred by his primary care doctor for further evaluation of these episodes.  Over the past 2 to 3 months he has had intermittent dizziness and near-syncope, mainly when rising from bending or sitting to standing. He describes lightheadedness with one episode where he felt the lights were going out while bending over to remove his dog's leash, but he has had no complete loss of consciousness. Symptoms are not constant and occur primarily when he stands up quickly.  He was evaluated in urgent care and the emergency department, including monitoring for heart rate irregularities. He wore a heart monitor for 2 weeks, which reportedly showed no significant abnormalities. He occasionally feels his heart racing, mainly at night, and less often during the day.  He has a prior coronary stent. He denies presyncope while sitting but reports intermittent nocturnal palpitations.         Today, he reports well and has no complaint  EKGs/Labs/Other Studies Reviewed Today:     Echocardiogram:  TTE June 2023 LVEF 55 to 60%.  Grade 1 diastolic dysfunction.   Monitors:  Zio monitor - 07/2024  [facility]) Sinus rhythm, 40 to 88 bpm, average 58 bpm rare PACs, occasional episodes of SVT up to  11 beats in duration rare PVCs.  No patient events.    EKG:         Physical Exam:    VS:  BP 112/60 (BP Location: Left Arm, Patient Position: Sitting, Cuff Size: Normal)   Pulse 63   Ht 5' 7 (1.702 m)   Wt 154 lb 8 oz (70.1 kg)   SpO2 98%   BMI 24.20 kg/m     Wt Readings from Last 3 Encounters:  09/16/24 154 lb 8 oz (70.1 kg)  04/25/22 159 lb 12.8 oz (72.5 kg)  02/12/22 165 lb (74.8 kg)     GEN: Well nourished, well developed in no acute distress CARDIAC: RRR, no murmurs, rubs, gallops RESPIRATORY:  Normal work of breathing MUSCULOSKELETAL: no edema    ASSESSMENT & PLAN:     Recurrent presyncope Per his description, this only occurs with sitting up, so sounds more orthostatic than arrhythmic Some episodes however have been associated with rapid rates and palpitations I do think ongoing monitoring is reasonable I discussed the indication and rationale for loop recorder placement.  I explained the risks including infection and bleeding and the monitoring process with the associated fee.  He would like to proceed  Coronary artery disease With stable angina on Imdur  30 mg, amlodipine  5 mg LAD stent in 2021 Coronary catheterization in June 20 23rd with mild nonobstructive disease and patent stent May benefit from metoprolol , but this has been held due to syncope    Signed, Eulas BRAVO Sela Falk,  MD  09/16/2024 3:00 PM    [facility] "

## 2024-09-16 ENCOUNTER — Encounter: Payer: Self-pay | Admitting: Cardiovascular Disease

## 2024-09-16 ENCOUNTER — Ambulatory Visit: Attending: Cardiovascular Disease | Admitting: Cardiovascular Disease

## 2024-09-16 VITALS — BP 112/60 | HR 63 | Ht 67.0 in | Wt 154.5 lb

## 2024-09-16 DIAGNOSIS — I2510 Atherosclerotic heart disease of native coronary artery without angina pectoris: Secondary | ICD-10-CM

## 2024-09-16 DIAGNOSIS — E118 Type 2 diabetes mellitus with unspecified complications: Secondary | ICD-10-CM

## 2024-09-16 DIAGNOSIS — I10 Essential (primary) hypertension: Secondary | ICD-10-CM

## 2024-09-16 NOTE — Patient Instructions (Signed)
 Medication Instructions:  Your physician recommends that you continue on your current medications as directed. Please refer to the Current Medication list given to you today.  *If you need a refill on your cardiac medications before your next appointment, please call your pharmacy*  Lab Work: None ordered.  If you have labs (blood work) drawn today and your tests are completely normal, you will receive your results only by: MyChart Message (if you have MyChart) OR A paper copy in the mail If you have any lab test that is abnormal or we need to change your treatment, we will call you to review the results.  Testing/Procedures: Dr Nancey plans to Implant an Internal Loop Recorder - We will contact you to schedule that appointment.  Follow-Up: At [facility], you and your health needs are our priority.  As part of our continuing mission to provide you with exceptional heart care, our providers are all part of one team.  This team includes your primary Cardiologist (physician) and Advanced Practice Providers or APPs (Physician Assistants and Nurse Practitioners) who all work together to provide you with the care you need, when you need it.  Your next appointment:   To be scheduled

## 2024-09-17 ENCOUNTER — Telehealth: Payer: Self-pay | Admitting: Cardiovascular Disease

## 2024-09-17 NOTE — Telephone Encounter (Signed)
 Calling to see if he can return to work. Please advise

## 2024-09-17 NOTE — Telephone Encounter (Signed)
 Spoke with patient. 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 Will send to Dr Nancey for advisement. Pt verbalizes understanding of plan.

## 2024-09-20 NOTE — Telephone Encounter (Signed)
 Spoke with the patient and advised upon recommendations from Dr. Nancey. Advised that I would put this in a letter. He will let us  know if his employer needs a copy.

## 2024-09-22 NOTE — Telephone Encounter (Signed)
 Spoke with the patient and advised that I can fax and/or mail him a copy of the letter. He would like both.

## 2024-09-22 NOTE — Telephone Encounter (Signed)
 Pt will need copy of letter for his employer. He is asking it be emailed to [email or web address]. Please advise.

## 2024-09-29 NOTE — Telephone Encounter (Signed)
 Spoke with the patient and advised that a letter was placed in the mail and faxed last week. Advised that it can take a couple of weeks to come through the mail. Patient verbalized understanding.

## 2024-09-29 NOTE — Telephone Encounter (Signed)
 Pt called to follow up please advise.

## 2024-12-03 ENCOUNTER — Ambulatory Visit: Admitting: Cardiovascular Disease
